# Patient Record
Sex: Male | Born: 1970 | Race: Black or African American | Hispanic: No | Marital: Married | Smoking: Never smoker
Health system: Southern US, Community
[De-identification: ages and names within clinical notes are randomized; demographics above are authoritative.]

## PROBLEM LIST (undated history)

## (undated) DIAGNOSIS — I10 Essential (primary) hypertension: Secondary | ICD-10-CM

## (undated) HISTORY — PX: COLON SURGERY: SHX602

---

## 2009-10-02 ENCOUNTER — Ambulatory Visit: Payer: Self-pay | Admitting: Family Medicine

## 2013-07-12 ENCOUNTER — Ambulatory Visit: Payer: Self-pay | Admitting: Family Medicine

## 2013-07-12 LAB — GC/CHLAMYDIA PROBE AMP

## 2013-12-14 DIAGNOSIS — R361 Hematospermia: Secondary | ICD-10-CM | POA: Insufficient documentation

## 2013-12-14 DIAGNOSIS — N433 Hydrocele, unspecified: Secondary | ICD-10-CM | POA: Insufficient documentation

## 2015-12-30 ENCOUNTER — Encounter: Payer: Self-pay | Admitting: Emergency Medicine

## 2015-12-30 ENCOUNTER — Ambulatory Visit
Admission: EM | Admit: 2015-12-30 | Discharge: 2015-12-30 | Disposition: A | Discharge status: Home / Self Care | Payer: 59 | Attending: Family Medicine | Admitting: Family Medicine

## 2015-12-30 ENCOUNTER — Ambulatory Visit (INDEPENDENT_AMBULATORY_CARE_PROVIDER_SITE_OTHER): Payer: 59

## 2015-12-30 DIAGNOSIS — S82891A Other fracture of right lower leg, initial encounter for closed fracture: Secondary | ICD-10-CM

## 2015-12-30 MED ORDER — MELOXICAM 15 MG PO TABS: 15.0000 mg | ORAL_TABLET | Freq: Every day | ORAL | Status: DC | PRN

## 2015-12-30 NOTE — ED Provider Notes (Signed)
[facility]  ____________________________________________  Time seen: Approximately 10:16 AM  I have reviewed the triage vital signs and the nursing notes.   HISTORY  Chief Complaint Ankle Pain   HPI SUNDAY CAPPEL is a 45 y.o. male presents with a complaint of medial right ankle pain since yesterday. Patient reports that yesterday afternoon he was playing basketball. Patient states that he turned quickly to turn and pivot and rolled ankle and felt a pop. He states he has had pain in the medial ankle since then. Denies other pain or injury. Denies fall to the ground. Denies head injury or loss consciousness.  Patient reports current pain is 7 out of 10. Patient states pain is primarily when walking. Patient states he has minimal pain if sitting still. Denies pain radiation. Denies numbness, tingling, loss of sensation. States some mild decreased range of motion. Reports has continued intermittent. States today has been using crutches.  Denies other pain or injury. Patient reports that approximately 20 years ago he had a mild injury to this ankle that he did have physical therapy for her. But states that he did have x-rays at the time but never had a fracture. Denies any other injury to this ankle.   History reviewed. No pertinent past medical history.  There are no active problems to display for this patient.   History reviewed. No pertinent past surgical history.  Current Outpatient Rx  Name  Route  Sig  Dispense  Refill  .             Allergies Review of patient's allergies indicates no known allergies.  History reviewed. No pertinent family history.  Social History Social History  Substance Use Topics  . Smoking status: Never Smoker   . Smokeless tobacco: None  . Alcohol Use: Yes    Review of Systems Constitutional: No fever/chills Eyes: No visual changes. ENT: No sore throat. Cardiovascular: Denies chest pain. Respiratory: Denies shortness of  breath. Gastrointestinal: No abdominal pain.  No nausea, no vomiting.  No diarrhea.  No constipation. Genitourinary: Negative for dysuria. Musculoskeletal: Negative for back pain.Right ankle pain.  Skin: Negative for rash. Neurological: Negative for headaches, focal weakness or numbness.  10-point ROS otherwise negative.  ____________________________________________   PHYSICAL EXAM:  VITAL SIGNS: ED Triage Vitals  Enc Vitals Group     BP 12/30/15 0911 139/87 mmHg     Pulse Rate 12/30/15 0911 77     Resp 12/30/15 0911 16     Temp 12/30/15 0911 97 F (36.1 C)     Temp Source 12/30/15 0911 Tympanic     SpO2 12/30/15 0911 99 %     Weight 12/30/15 0911 205 lb (92.987 kg)     Height 12/30/15 0911 5\' 7"  (1.702 m)     Head Cir --      Peak Flow --      Pain Score 12/30/15 0914 6     Pain Loc --      Pain Edu? --      Excl. in [place]? --     Constitutional: Alert and oriented. Well appearing and in no acute distress. Eyes: Conjunctivae are normal. PERRL. EOMI. Head: Atraumatic.  Nose: No congestion/rhinnorhea.  Mouth/Throat: Mucous membranes are moist.  Oropharynx non-erythematous. Neck: No stridor.  No cervical spine tenderness to palpation. Cardiovascular: Normal rate, regular rhythm. Grossly normal heart sounds.  Good peripheral circulation. Respiratory: Normal respiratory effort.  No retractions. Lungs CTAB. Musculoskeletal: No lower or upper extremity tenderness nor edema. Bilateral  pedal pulses equal and easily palpated.  Except: distal medial malleolus point moderate tenderness to palpation, no surrounding tenderness, mild swelling, no ecchymosis, skin intact, mildly limited rotation, no pain with plantar or dorsiflexion, Right lower extremity otherwise nontender. Capillary refill <2seconds to all right distal toes with sensation intact. Mild antalgic gait.  Neurologic:  Normal speech and language. No gross focal neurologic deficits are appreciated. No gait instability. Skin:   Skin is warm, dry and intact. No rash noted. Psychiatric: Mood and affect are normal. Speech and behavior are normal.  ____________________________________________   LABS (all labs ordered are listed, but only abnormal results are displayed)  Labs Reviewed - No data to display  RADIOLOGY  EXAM: RIGHT ANKLE - COMPLETE 3+ VIEW  COMPARISON: None.  FINDINGS: The small bone fragments are noted adjacent to the medial malleolus which appear well corticated and likely related to old injury. No acute fracture, subluxation or dislocation. Joint spaces are maintained. Soft tissues are intact.  IMPRESSION: Well corticated bone fragments adjacent to the medial malleolus felt to be related to old injury. No definite acute fracture.   Electronically Signed By: Rolm Baptise M.D. On: 12/30/2015 09:54  I, Marylene Land, personally viewed and evaluated these images (plain radiographs) as part of my medical decision making, as well as reviewing the written report by the radiologist.  _________________________________________   PROCEDURES  Procedure(s) performed:  Stirrup ocl splint applied by RN. Intact post application. ____________________________________________   INITIAL IMPRESSION / ASSESSMENT AND PLAN / ED COURSE  Pertinent labs & imaging results that were available during my care of the patient were reviewed by me and considered in my medical decision making (see chart for details).  Very well-appearing patient. No acute distress. Presents for complaints of medial ankle pain since yesterday. Patient point tenderness on medial right malleolus. No other surrounding tenderness minimal swelling. Mild decreased range of motion ankle rotation. Denies any other pain or injury. Right ankle x-ray with well-corticated bone fragments adjacent to the medial malleolus felt to be related to an old injury, no definite acute fracture per radiologist. As x-ray fragments consistent with point  tenderness by palpation as well as with NO previous known fracture to this ankle will  treat as an acute fracture. Splint applied. Patient has crutches. Will treat with oral Mobic. Patient denies any other need for additional pain medication. Follow-up with orthopedics this week. Patient states he'll call today to schedule follow-up. Denies need for crutches.   Discussed follow up with Primary care physician this week. Discussed follow up and return parameters including no resolution or any worsening concerns. Patient verbalized understanding and agreed to plan.   ____________________________________________   FINAL CLINICAL IMPRESSION(S) / ED DIAGNOSES  Final diagnoses:  Closed right ankle fracture, initial encounter      Note: This dictation was prepared with Dragon dictation along with smaller phrase technology. Any transcriptional errors that result from this process are unintentional.    Marylene Land, NP 12/30/15 1039

## 2015-12-30 NOTE — Discharge Instructions (Signed)
Take medication as prescribed. Rest. Apply ice and elevate. Use crutches. Keep in splint.  Call today to schedule appointment follow-up with orthopedic. See above to call.  Follow up with your primary care physician this week as needed. Return to Urgent care for new or worsening concerns.

## 2015-12-30 NOTE — ED Notes (Signed)
Patient states that he was playing basketball and put pressure on his right ankle and felt a pop in his ankle yesterday.  Patient report pain and swelling in his right ankle.

## 2016-12-28 ENCOUNTER — Encounter: Payer: Self-pay | Admitting: *Deleted

## 2016-12-28 ENCOUNTER — Ambulatory Visit
Admission: EM | Admit: 2016-12-28 | Discharge: 2016-12-28 | Disposition: A | Discharge status: Home / Self Care | Payer: 59 | Attending: Family Medicine | Admitting: Family Medicine

## 2016-12-28 DIAGNOSIS — T148XXA Other injury of unspecified body region, initial encounter: Secondary | ICD-10-CM

## 2016-12-28 DIAGNOSIS — Z23 Encounter for immunization: Secondary | ICD-10-CM

## 2016-12-28 DIAGNOSIS — W60XXXA Contact with nonvenomous plant thorns and spines and sharp leaves, initial encounter: Secondary | ICD-10-CM

## 2016-12-28 DIAGNOSIS — L03011 Cellulitis of right finger: Secondary | ICD-10-CM | POA: Diagnosis not present

## 2016-12-28 MED ORDER — TETANUS-DIPHTH-ACELL PERTUSSIS 5-2.5-18.5 LF-MCG/0.5 IM SUSP
0.5000 mL | Freq: Once | INTRAMUSCULAR | Status: AC
  Administered 2016-12-28: 0.5 mL via INTRAMUSCULAR

## 2016-12-28 MED ORDER — SULFAMETHOXAZOLE-TRIMETHOPRIM 800-160 MG PO TABS: 1.0000 | ORAL_TABLET | Freq: Two times a day (BID) | ORAL | 0 refills | Status: AC

## 2016-12-28 MED ORDER — MUPIROCIN 2 % EX OINT: TOPICAL_OINTMENT | CUTANEOUS | 0 refills | Status: DC

## 2016-12-28 NOTE — ED Triage Notes (Signed)
Pt got thorn in right middle finger while mowing yesterday. Now c/o swollen, painful right middle finger.

## 2016-12-28 NOTE — ED Provider Notes (Signed)
[facility] ____________________________________________  Time seen: Approximately 9:50 AM  I have reviewed the triage vital signs and the nursing notes.   HISTORY  Chief Complaint Hand Pain   HPI PEACE JOST is a 46 y.o. male presents for evaluation of right third digit redness and swelling. Patient reports that yesterday he was working in his yard, mowing grass and pulling weeds. Patient reports that he reached down into a rose bush and got a rose thorn stuck in his right third finger but did not realize it right away. Patient reports shortly after he noticed and then pulled the thorn out. Patient states that he and his wife tried to inspect the area last night as they were concerned for retained piece of the thorn, but did not see anything. Reports since last night the area has become tender, puffy and slightly red. Denies any drainage, paresthesias, pain radiation, decreased movement. Reports right-hand dominant. Unsure of last tetanus immunization. States mild pain at this time. Reports otherwise feels well. Denies fall, direct trauma or crush injury.  Denies chest pain, shortness of breath, abdominal pain. Denies recent sickness. Denies recent antibiotic use. Denies renal insufficiency.   History reviewed. No pertinent past medical history.  There are no active problems to display for this patient.   History reviewed. No pertinent surgical history.   No current facility-administered medications for this encounter.   Current Outpatient Prescriptions:  .  meloxicam (MOBIC) 15 MG tablet, Take 1 tablet (15 mg total) by mouth daily as needed for pain., Disp: 10 tablet, Rfl: 0 .  mupirocin ointment (BACTROBAN) 2 %, Apply three times a day for 7 days., Disp: 22 g, Rfl: 0 .  sulfamethoxazole-trimethoprim (BACTRIM DS,SEPTRA DS) 800-160 MG tablet, Take 1 tablet by mouth 2 (two) times daily., Disp: 20 tablet, Rfl: 0  Allergies Patient has no known allergies.  History  reviewed. No pertinent family history.  Social History Social History  Substance Use Topics  . Smoking status: Never Smoker  . Smokeless tobacco: Never Used  . Alcohol use Yes    Review of Systems Constitutional: No fever/chills Cardiovascular: Denies chest pain. Respiratory: Denies shortness of breath. Gastrointestinal: No abdominal pain.   Genitourinary: Negative for dysuria. Musculoskeletal: Negative for back pain. Skin: As above.   ____________________________________________   PHYSICAL EXAM:  VITAL SIGNS: ED Triage Vitals  Enc Vitals Group     BP 12/28/16 0858 (!) 153/99     Pulse Rate 12/28/16 0858 70     Resp 12/28/16 0858 16     Temp 12/28/16 0858 98 F (36.7 C)     Temp Source 12/28/16 0858 Oral     SpO2 12/28/16 0858 99 %     Weight 12/28/16 0901 220 lb (99.8 kg)     Height 12/28/16 0901 5\' 7"  (1.702 m)     Head Circumference --      Peak Flow --      Pain Score 12/28/16 0944 1     Pain Loc --      Pain Edu? --      Excl. in [place]? --     Constitutional: Alert and oriented. Well appearing and in no acute distress. Hematological/Lymphatic/Immunilogical: No cervical lymphadenopathy. Cardiovascular: Normal rate, regular rhythm. Grossly normal heart sounds.  Good peripheral circulation. Respiratory: Normal respiratory effort without tachypnea nor retractions. Breath sounds are clear and equal bilaterally. No wheezes, rales, rhonchi. Gastrointestinal: Soft and nontender. No distention. Normal Bowel sounds. No CVA tenderness. Musculoskeletal:  Nontender with normal range  of motion in all extremities. No midline cervical, thoracic or lumbar tenderness to palpation. Bilateral pedal pulses equal and easily palpated.      Right lower leg:  No tenderness or edema.      Left lower leg:  No tenderness or edema.  Neurologic:  Normal speech and language. Speech is normal. No gait instability.  Skin:  Skin is warm, dry. Except: Right third digit radial aspect adjacent to  PIP joint localize area of mild erythema approximately 2 x 2 centimeters with mild localized edema, no point bony tenderness, mild tenderness directly over the erythematous site, no foreign body visualized, no drainage, no fluctuance or induration, wound inspected as below without foreign body found, full range of motion present, no motor or tendon deficit noted to right hand. Right hand based upon superficial appearing splinter noted, no surrounding erythema, no drainage, nontender, removed as below and no appearance of retained foreign body noted. Right hand otherwise nontender. Psychiatric: Mood and affect are normal. Speech and behavior are normal. Patient exhibits appropriate insight and judgment   ___________________________________________   LABS (all labs ordered are listed, but only abnormal results are displayed)   RADIOLOGY  No results found. ____________________________________________   PROCEDURES Procedures   Procedure explained and verbal consent obtained for superficial wound exploration and removal of foreign body. Area cleaned and prepped with Betadine. Sterile #11 blade scalpel and forceps utilized and very superficially inspected wound to right third digit with slight and no retained foreign body visualized; area cleaned with Betadine to right palm where splinter visible and sterile #11 blade scalpel and forceps utilized and superficially infected skin and removed splinter. Patient tolerated well. No bleeding.  INITIAL IMPRESSION / ASSESSMENT AND PLAN / ED COURSE  Pertinent labs & imaging results that were available during my care of the patient were reviewed by me and considered in my medical decision making (see chart for details).  Well-appearing patient. No acute distress. Right third digit onset of cellulitis, no retained foreign body visualized or noted upon superficial exploration. Right palm foreign body removed. Will treat patient with oral Bactrim and topical  Bactroban. Tetanus immunization updated. Discussed cleaning and wound care. Discussed strict follow-up and return parameters.Discussed indication, risks and benefits of medications with patient.  Discussed follow up with Primary care physician this week. Discussed follow up and return parameters including no resolution or any worsening concerns. Patient verbalized understanding and agreed to plan.   ____________________________________________   FINAL CLINICAL IMPRESSION(S) / ED DIAGNOSES  Final diagnoses:  Cellulitis of right middle finger  Splinter     Discharge Medication List as of 12/28/2016  9:41 AM    START taking these medications   Details  mupirocin ointment (BACTROBAN) 2 % Apply three times a day for 7 days., Normal    sulfamethoxazole-trimethoprim (BACTRIM DS,SEPTRA DS) 800-160 MG tablet Take 1 tablet by mouth 2 (two) times daily., Starting Mon 12/28/2016, Until Thu 01/07/2017, Normal        Note: This dictation was prepared with Dragon dictation along with smaller phrase technology. Any transcriptional errors that result from this process are unintentional.         Marylene Land, NP 12/28/16 [phone]

## 2016-12-28 NOTE — Discharge Instructions (Signed)
Take medication as prescribed. Keep clean. Elevate. Monitor.   Follow up with your primary care physician this week as needed. Return to Urgent care for new or worsening concerns.

## 2018-02-04 ENCOUNTER — Ambulatory Visit: Payer: 59 | Admitting: Family Medicine

## 2018-02-25 ENCOUNTER — Ambulatory Visit: Payer: 59 | Admitting: Primary Care

## 2018-02-28 ENCOUNTER — Encounter: Payer: Self-pay | Admitting: Primary Care

## 2018-02-28 ENCOUNTER — Ambulatory Visit (INDEPENDENT_AMBULATORY_CARE_PROVIDER_SITE_OTHER): Payer: 59 | Admitting: Primary Care

## 2018-02-28 ENCOUNTER — Encounter (INDEPENDENT_AMBULATORY_CARE_PROVIDER_SITE_OTHER): Payer: Self-pay

## 2018-02-28 VITALS — BP 138/84 | HR 67 | Temp 98.2°F | Ht 67.0 in | Wt 217.2 lb

## 2018-02-28 DIAGNOSIS — Z0000 Encounter for general adult medical examination without abnormal findings: Secondary | ICD-10-CM | POA: Diagnosis not present

## 2018-02-28 DIAGNOSIS — Z1211 Encounter for screening for malignant neoplasm of colon: Secondary | ICD-10-CM | POA: Diagnosis not present

## 2018-02-28 DIAGNOSIS — Z8042 Family history of malignant neoplasm of prostate: Secondary | ICD-10-CM

## 2018-02-28 NOTE — Patient Instructions (Signed)
Stop by the lab prior to leaving today. I will notify you of your results once received.   Continue exercising. You should be getting 150 minutes of moderate intensity exercise weekly.  Increase consumption of vegetables, fruit, whole grains, lean protein.  Ensure you are consuming 64 ounces of water daily.  Your blood pressure should be less than 135/90.   Follow up in 1 year for your annual exam or sooner if needed.  It was a pleasure to meet you today! Please don't hesitate to call or message me with any questions. Welcome to [facility]!   DASH Eating Plan DASH stands for "Dietary Approaches to Stop Hypertension." The DASH eating plan is a healthy eating plan that has been shown to reduce high blood pressure (hypertension). It may also reduce your risk for type 2 diabetes, heart disease, and stroke. The DASH eating plan may also help with weight loss. What are tips for following this plan? General guidelines  Avoid eating more than 2,300 mg (milligrams) of salt (sodium) a day. If you have hypertension, you may need to reduce your sodium intake to 1,500 mg a day.  Limit alcohol intake to no more than 1 drink a day for nonpregnant women and 2 drinks a day for men. One drink equals 12 oz of beer, 5 oz of wine, or 1 oz of hard liquor.  Work with your health care provider to maintain a healthy body weight or to lose weight. Ask what an ideal weight is for you.  Get at least 30 minutes of exercise that causes your heart to beat faster (aerobic exercise) most days of the week. Activities may include walking, swimming, or biking.  Work with your health care provider or diet and nutrition specialist (dietitian) to adjust your eating plan to your individual calorie needs. Reading food labels  Check food labels for the amount of sodium per serving. Choose foods with less than 5 percent of the Daily Value of sodium. Generally, foods with less than 300 mg of sodium per serving fit into this  eating plan.  To find whole grains, look for the word "whole" as the first word in the ingredient list. Shopping  Buy products labeled as "low-sodium" or "no salt added."  Buy fresh foods. Avoid canned foods and premade or frozen meals. Cooking  Avoid adding salt when cooking. Use salt-free seasonings or herbs instead of table salt or sea salt. Check with your health care provider or pharmacist before using salt substitutes.  Do not fry foods. Cook foods using healthy methods such as baking, boiling, grilling, and broiling instead.  Cook with heart-healthy oils, such as olive, canola, soybean, or sunflower oil. Meal planning   Eat a balanced diet that includes: ? 5 or more servings of fruits and vegetables each day. At each meal, try to fill half of your plate with fruits and vegetables. ? Up to 6-8 servings of whole grains each day. ? Less than 6 oz of lean meat, poultry, or fish each day. A 3-oz serving of meat is about the same size as a deck of cards. One egg equals 1 oz. ? 2 servings of low-fat dairy each day. ? A serving of nuts, seeds, or beans 5 times each week. ? Heart-healthy fats. Healthy fats called Omega-3 fatty acids are found in foods such as flaxseeds and coldwater fish, like sardines, salmon, and mackerel.  Limit how much you eat of the following: ? Canned or prepackaged foods. ? Food that is high in trans  fat, such as fried foods. ? Food that is high in saturated fat, such as fatty meat. ? Sweets, desserts, sugary drinks, and other foods with added sugar. ? Full-fat dairy products.  Do not salt foods before eating.  Try to eat at least 2 vegetarian meals each week.  Eat more home-cooked food and less restaurant, buffet, and fast food.  When eating at a restaurant, ask that your food be prepared with less salt or no salt, if possible. What foods are recommended? The items listed may not be a complete list. Talk with your dietitian about what dietary choices  are best for you. Grains Whole-grain or whole-wheat bread. Whole-grain or whole-wheat pasta. Brown rice. [place]. Bulgur. Whole-grain and low-sodium cereals. Pita bread. Low-fat, low-sodium crackers. Whole-wheat flour tortillas. Vegetables Fresh or frozen vegetables (raw, steamed, roasted, or grilled). Low-sodium or reduced-sodium tomato and vegetable juice. Low-sodium or reduced-sodium tomato sauce and tomato paste. Low-sodium or reduced-sodium canned vegetables. Fruits All fresh, dried, or frozen fruit. Canned fruit in natural juice (without added sugar). Meat and other protein foods Skinless chicken or [place]. Ground chicken or [place]. Pork with fat trimmed off. Fish and seafood. Egg whites. Dried beans, peas, or lentils. Unsalted nuts, nut butters, and seeds. Unsalted canned beans. Lean cuts of beef with fat trimmed off. Low-sodium, lean deli meat. Dairy Low-fat (1%) or fat-free (skim) milk. Fat-free, low-fat, or reduced-fat cheeses. Nonfat, low-sodium ricotta or cottage cheese. Low-fat or nonfat yogurt. Low-fat, low-sodium cheese. Fats and oils Soft margarine without trans fats. Vegetable oil. Low-fat, reduced-fat, or light mayonnaise and salad dressings (reduced-sodium). Canola, safflower, olive, soybean, and sunflower oils. Avocado. Seasoning and other foods Herbs. Spices. Seasoning mixes without salt. Unsalted popcorn and pretzels. Fat-free sweets. What foods are not recommended? The items listed may not be a complete list. Talk with your dietitian about what dietary choices are best for you. Grains Baked goods made with fat, such as croissants, muffins, or some breads. Dry pasta or rice meal packs. Vegetables Creamed or fried vegetables. Vegetables in a cheese sauce. Regular canned vegetables (not low-sodium or reduced-sodium). Regular canned tomato sauce and paste (not low-sodium or reduced-sodium). Regular tomato and vegetable juice (not low-sodium or reduced-sodium). Angie Fava.  Olives. Fruits Canned fruit in a light or heavy syrup. Fried fruit. Fruit in cream or butter sauce. Meat and other protein foods Fatty cuts of meat. Ribs. Fried meat. Berniece Salines. Sausage. Bologna and other processed lunch meats. Salami. Fatback. Hotdogs. Bratwurst. Salted nuts and seeds. Canned beans with added salt. Canned or smoked fish. Whole eggs or egg yolks. Chicken or [place] with skin. Dairy Whole or 2% milk, cream, and half-and-half. Whole or full-fat cream cheese. Whole-fat or sweetened yogurt. Full-fat cheese. Nondairy creamers. Whipped toppings. Processed cheese and cheese spreads. Fats and oils Butter. Stick margarine. Lard. Shortening. Ghee. Bacon fat. Tropical oils, such as coconut, palm kernel, or palm oil. Seasoning and other foods Salted popcorn and pretzels. Onion salt, garlic salt, seasoned salt, table salt, and sea salt. Worcestershire sauce. Tartar sauce. Barbecue sauce. Teriyaki sauce. Soy sauce, including reduced-sodium. Steak sauce. Canned and packaged gravies. Fish sauce. Oyster sauce. Cocktail sauce. Horseradish that you find on the shelf. Ketchup. Mustard. Meat flavorings and tenderizers. Bouillon cubes. Hot sauce and Tabasco sauce. Premade or packaged marinades. Premade or packaged taco seasonings. Relishes. Regular salad dressings. Where to find more information:  National Heart, Lung, and [place]: https://wilson-eaton.com/  American Heart Association: www.heart.org Summary  The DASH eating plan is a healthy eating plan that has  been shown to reduce high blood pressure (hypertension). It may also reduce your risk for type 2 diabetes, heart disease, and stroke.  With the DASH eating plan, you should limit salt (sodium) intake to 2,300 mg a day. If you have hypertension, you may need to reduce your sodium intake to 1,500 mg a day.  When on the DASH eating plan, aim to eat more fresh fruits and vegetables, whole grains, lean proteins, low-fat dairy, and heart-healthy  fats.  Work with your health care provider or diet and nutrition specialist (dietitian) to adjust your eating plan to your individual calorie needs. This information is not intended to replace advice given to you by your health care provider. Make sure you discuss any questions you have with your health care provider. Document Released: 08/20/2011 Document Revised: 08/24/2016 Document Reviewed: 08/24/2016 Elsevier Interactive Patient Education  [employer].

## 2018-02-28 NOTE — Progress Notes (Signed)
Subjective:    Patient ID: Keith Mills, male    DOB: [birth year], 47 y.o.   MRN: [id number]  HPI  Mr. Delange is a 47 year old male who presents today to establish care and for complete physical.  His father was diagnosed with prostate cancer at age 70. He was told by his brother's doctor to get screened. He would also like a screening colonoscopy. He has no complaints today.   Immunizations: -Tetanus: Completed in 2018 -Influenza: Did complete last season   Diet: He endorses a fair diet Breakfast: Eggs, waffles, sausage occasionally Lunch: Salad, hot dogs, fast food Dinner: Protein, some vegetables, starch Snacks: Occasionally fruit, chips, nuts Desserts: Once weekly Beverages: Some water, sparkling water, lemonade, occasional sweet tea  Exercise: He Is working out at [employer] 4 days weekly Eye exam: Completed 1 year ago Dental exam: Completes semi-annual    Review of Systems  Constitutional: Negative for unexpected weight change.  HENT: Negative for rhinorrhea.   Respiratory: Negative for cough and shortness of breath.   Cardiovascular: Negative for chest pain.  Gastrointestinal: Negative for constipation and diarrhea.  Genitourinary: Negative for difficulty urinating.  Musculoskeletal: Negative for arthralgias and myalgias.  Skin: Negative for rash.  Allergic/Immunologic: Negative for environmental allergies.  Neurological: Negative for dizziness, numbness and headaches.  Psychiatric/Behavioral: The patient is not nervous/anxious.        History reviewed. No pertinent past medical history.   Social History   Socioeconomic History  . Marital status: Married    Spouse name: Not on file  . Number of children: Not on file  . Years of education: Not on file  . Highest education level: Not on file  Occupational History  . Not on file  Social Needs  . Financial resource strain: Not on file  . Food insecurity:    Worry: Not on file    Inability: Not on file    . Transportation needs:    Medical: Not on file    Non-medical: Not on file  Tobacco Use  . Smoking status: Never Smoker  . Smokeless tobacco: Never Used  Substance and Sexual Activity  . Alcohol use: Yes  . Drug use: Not on file  . Sexual activity: Not on file  Lifestyle  . Physical activity:    Days per week: Not on file    Minutes per session: Not on file  . Stress: Not on file  Relationships  . Social connections:    Talks on phone: Not on file    Gets together: Not on file    Attends religious service: Not on file    Active member of club or organization: Not on file    Attends meetings of clubs or organizations: Not on file    Relationship status: Not on file  . Intimate partner violence:    Fear of current or ex partner: Not on file    Emotionally abused: Not on file    Physically abused: Not on file    Forced sexual activity: Not on file  Other Topics Concern  . Not on file  Social History Narrative   Married.   2 children.   Works as a Armed forces operational officer.   Enjoys playing sports with his children, exercising.     History reviewed. No pertinent surgical history.  Family History  Problem Relation Age of Onset  . Kidney failure Mother   . Hypertension Mother   . Breast cancer Mother   . Prostate cancer Father  82  . Heart disease Father   . Hypertension Sister   . Colon polyps Sister     No Known Allergies  No current outpatient medications on file prior to visit.   No current facility-administered medications on file prior to visit.     BP 138/84   Pulse 67   Temp 98.2 F (36.8 C) (Oral)   Ht 5\' 7"  (1.702 m)   Wt 217 lb 4 oz (98.5 kg)   SpO2 97%   BMI 34.03 kg/m    Objective:   Physical Exam  Constitutional: He is oriented to person, place, and time. He appears well-nourished.  HENT:  Mouth/Throat: No oropharyngeal exudate.  Eyes: Pupils are equal, round, and reactive to light. EOM are normal.  Neck: Neck supple. No thyromegaly present.   Cardiovascular: Normal rate and regular rhythm.  Respiratory: Effort normal and breath sounds normal.  GI: Soft. Bowel sounds are normal. There is no tenderness.  Musculoskeletal: Normal range of motion.  Neurological: He is alert and oriented to person, place, and time.  Skin: Skin is warm and dry.  Psychiatric: He has a normal mood and affect.           Assessment & Plan:

## 2018-03-01 ENCOUNTER — Encounter: Payer: Self-pay | Admitting: *Deleted

## 2018-03-01 LAB — LIPID PANEL
CHOLESTEROL: 166 mg/dL (ref 0–200)
HDL: 45.2 mg/dL (ref >39.00)
NonHDL: 121.21
Total CHOL/HDL Ratio: 4
Triglycerides: 335 mg/dL — ABNORMAL HIGH (ref 0.0–149.0)
VLDL: 67 mg/dL — AB (ref 0.0–40.0)

## 2018-03-01 LAB — COMPREHENSIVE METABOLIC PANEL
ALBUMIN: 4.3 g/dL (ref 3.5–5.2)
ALK PHOS: 69 U/L (ref 39–117)
ALT: 30 U/L (ref 0–53)
AST: 31 U/L (ref 0–37)
BILIRUBIN TOTAL: 0.5 mg/dL (ref 0.2–1.2)
BUN: 18 mg/dL (ref 6–23)
CO2: 28 mEq/L (ref 19–32)
CREATININE: 1.37 mg/dL (ref 0.40–1.50)
Calcium: 9.7 mg/dL (ref 8.4–10.5)
Chloride: 102 mEq/L (ref 96–112)
GFR: 71.26 mL/min (ref >60.00)
GLUCOSE: 99 mg/dL (ref 70–99)
POTASSIUM: 4.4 meq/L (ref 3.5–5.1)
SODIUM: 137 meq/L (ref 135–145)
TOTAL PROTEIN: 7 g/dL (ref 6.0–8.3)

## 2018-03-01 LAB — LDL CHOLESTEROL, DIRECT: LDL DIRECT: 77 mg/dL

## 2018-03-01 LAB — PSA: PSA: 0.95 ng/mL (ref 0.10–4.00)

## 2018-03-30 ENCOUNTER — Encounter: Payer: Self-pay | Admitting: *Deleted

## 2019-03-07 ENCOUNTER — Ambulatory Visit (INDEPENDENT_AMBULATORY_CARE_PROVIDER_SITE_OTHER): Payer: 59 | Admitting: Primary Care

## 2019-03-07 ENCOUNTER — Encounter: Payer: Self-pay | Admitting: Primary Care

## 2019-03-07 ENCOUNTER — Other Ambulatory Visit: Payer: Self-pay | Admitting: Primary Care

## 2019-03-07 ENCOUNTER — Other Ambulatory Visit: Payer: Self-pay

## 2019-03-07 VITALS — BP 136/82 | HR 76 | Temp 98.6°F | Ht 67.0 in | Wt 220.2 lb

## 2019-03-07 DIAGNOSIS — Z0000 Encounter for general adult medical examination without abnormal findings: Secondary | ICD-10-CM | POA: Insufficient documentation

## 2019-03-07 DIAGNOSIS — R739 Hyperglycemia, unspecified: Secondary | ICD-10-CM

## 2019-03-07 DIAGNOSIS — Z0001 Encounter for general adult medical examination with abnormal findings: Secondary | ICD-10-CM | POA: Insufficient documentation

## 2019-03-07 LAB — LIPID PANEL
Cholesterol: 147 mg/dL (ref 0–200)
HDL: 51.7 mg/dL (ref >39.00)
LDL Cholesterol: 82 mg/dL (ref 0–99)
NonHDL: 95.29
Total CHOL/HDL Ratio: 3
Triglycerides: 64 mg/dL (ref 0.0–149.0)
VLDL: 12.8 mg/dL (ref 0.0–40.0)

## 2019-03-07 LAB — COMPREHENSIVE METABOLIC PANEL
ALT: 25 U/L (ref 0–53)
AST: 30 U/L (ref 0–37)
Albumin: 4.3 g/dL (ref 3.5–5.2)
Alkaline Phosphatase: 71 U/L (ref 39–117)
BUN: 22 mg/dL (ref 6–23)
CO2: 26 mEq/L (ref 19–32)
Calcium: 9.2 mg/dL (ref 8.4–10.5)
Chloride: 104 mEq/L (ref 96–112)
Creatinine, Ser: 1.41 mg/dL (ref 0.40–1.50)
GFR: 64.58 mL/min (ref >60.00)
Glucose, Bld: 123 mg/dL — ABNORMAL HIGH (ref 70–99)
Potassium: 4.3 mEq/L (ref 3.5–5.1)
Sodium: 136 mEq/L (ref 135–145)
Total Bilirubin: 0.9 mg/dL (ref 0.2–1.2)
Total Protein: 7 g/dL (ref 6.0–8.3)

## 2019-03-07 NOTE — Patient Instructions (Signed)
Stop by the lab prior to leaving today. I will notify you of your results once received.   Continue exercising. You should be getting 150 minutes of moderate intensity exercise weekly.  Continue to work on a healthy diet.   Ensure you are consuming 64 ounces of water daily.  It was a pleasure to see you today!   Preventive Care 40-64 Years, Male Preventive care refers to lifestyle choices and visits with your health care provider that can promote health and wellness. What does preventive care include?   A yearly physical exam. This is also called an annual well check.  Dental exams once or twice a year.  Routine eye exams. Ask your health care provider how often you should have your eyes checked.  Personal lifestyle choices, including: ? Daily care of your teeth and gums. ? Regular physical activity. ? Eating a healthy diet. ? Avoiding tobacco and drug use. ? Limiting alcohol use. ? Practicing safe sex. ? Taking low-dose aspirin every day starting at age 61. What happens during an annual well check? The services and screenings done by your health care provider during your annual well check will depend on your age, overall health, lifestyle risk factors, and family history of disease. Counseling Your health care provider may ask you questions about your:  Alcohol use.  Tobacco use.  Drug use.  Emotional well-being.  Home and relationship well-being.  Sexual activity.  Eating habits.  Work and work Statistician. Screening You may have the following tests or measurements:  Height, weight, and BMI.  Blood pressure.  Lipid and cholesterol levels. These may be checked every 5 years, or more frequently if you are over 46 years old.  Skin check.  Lung cancer screening. You may have this screening every year starting at age 100 if you have a 30-pack-year history of smoking and currently smoke or have quit within the past 15 years.  Colorectal cancer screening. All  adults should have this screening starting at age 39 and continuing until age 70. Your health care provider may recommend screening at age 85. You will have tests every 1-10 years, depending on your results and the type of screening test. People at increased risk should start screening at an earlier age. Screening tests may include: ? Guaiac-based fecal occult blood testing. ? Fecal immunochemical test (FIT). ? Stool DNA test. ? Virtual colonoscopy. ? Sigmoidoscopy. During this test, a flexible tube with a tiny camera (sigmoidoscope) is used to examine your rectum and lower colon. The sigmoidoscope is inserted through your anus into your rectum and lower colon. ? Colonoscopy. During this test, a long, thin, flexible tube with a tiny camera (colonoscope) is used to examine your entire colon and rectum.  Prostate cancer screening. Recommendations will vary depending on your family history and other risks.  Hepatitis C blood test.  Hepatitis B blood test.  Sexually transmitted disease (STD) testing.  Diabetes screening. This is done by checking your blood sugar (glucose) after you have not eaten for a while (fasting). You may have this done every 1-3 years. Discuss your test results, treatment options, and if necessary, the need for more tests with your health care provider. Vaccines Your health care provider may recommend certain vaccines, such as:  Influenza vaccine. This is recommended every year.  Tetanus, diphtheria, and acellular pertussis (Tdap, Td) vaccine. You may need a Td booster every 10 years.  Varicella vaccine. You may need this if you have not been vaccinated.  Zoster vaccine. You  may need this after age 60.  Measles, mumps, and rubella (MMR) vaccine. You may need at least one dose of MMR if you were born in 1957 or later. You may also need a second dose.  Pneumococcal 13-valent conjugate (PCV13) vaccine. You may need this if you have certain conditions and have not been  vaccinated.  Pneumococcal polysaccharide (PPSV23) vaccine. You may need one or two doses if you smoke cigarettes or if you have certain conditions.  Meningococcal vaccine. You may need this if you have certain conditions.  Hepatitis A vaccine. You may need this if you have certain conditions or if you travel or work in places where you may be exposed to hepatitis A.  Hepatitis B vaccine. You may need this if you have certain conditions or if you travel or work in places where you may be exposed to hepatitis B.  Haemophilus influenzae type b (Hib) vaccine. You may need this if you have certain risk factors. Talk to your health care provider about which screenings and vaccines you need and how often you need them. This information is not intended to replace advice given to you by your health care provider. Make sure you discuss any questions you have with your health care provider. Document Released: 09/27/2015 Document Revised: 10/21/2017 Document Reviewed: 07/02/2015 Elsevier Interactive Patient Education  2019 Elsevier Inc.  

## 2019-03-07 NOTE — Assessment & Plan Note (Signed)
Tetanus UTD. PSA from 2019 stable, repeat at age 48 next year. Commended him on a healthy diet and regular exercise, encouraged to continue.  Exam unremarkable. Labs pending. Follow up in 1 year for CPE.

## 2019-03-07 NOTE — Progress Notes (Signed)
Subjective:    Patient ID: Keith Mills, male    DOB: November 25, 1970, 48 y.o.   MRN: [id number]  HPI  Keith Mills is a 48 year old male who presents today for complete physical. He has no complaints today.  Immunizations: -Tetanus: Completed in 2018 -Influenza: Completes annually   Diet: He endorses a healthy diet. Recently started Keto diet and is limiting carbs and sugars. He is drinking mostly water, flavored water, unsweet tea.  Exercise: He is walking, some running, weight lifting  Eye exam: Completed 2 years ago. Dental exam: Completes semi-annually  PSA: Family history of prostate cancer in father. PSA in 2019 of 0.95  Review of Systems  Constitutional: Negative for unexpected weight change.  HENT: Negative for rhinorrhea.   Respiratory: Negative for cough and shortness of breath.   Cardiovascular: Negative for chest pain.  Gastrointestinal: Negative for constipation and diarrhea.  Genitourinary: Negative for difficulty urinating.  Musculoskeletal: Negative for arthralgias and myalgias.  Skin: Negative for rash.  Allergic/Immunologic: Negative for environmental allergies.  Neurological: Negative for dizziness, numbness and headaches.  Psychiatric/Behavioral: The patient is not nervous/anxious.        No past medical history on file.   Social History   Socioeconomic History  . Marital status: Married    Spouse name: Not on file  . Number of children: Not on file  . Years of education: Not on file  . Highest education level: Not on file  Occupational History  . Not on file  Social Needs  . Financial resource strain: Not on file  . Food insecurity    Worry: Not on file    Inability: Not on file  . Transportation needs    Medical: Not on file    Non-medical: Not on file  Tobacco Use  . Smoking status: Never Smoker  . Smokeless tobacco: Never Used  Substance and Sexual Activity  . Alcohol use: Yes  . Drug use: Not on file  . Sexual activity: Not on file   Lifestyle  . Physical activity    Days per week: Not on file    Minutes per session: Not on file  . Stress: Not on file  Relationships  . Social Herbalist on phone: Not on file    Gets together: Not on file    Attends religious service: Not on file    Active member of club or organization: Not on file    Attends meetings of clubs or organizations: Not on file    Relationship status: Not on file  . Intimate partner violence    Fear of current or ex partner: Not on file    Emotionally abused: Not on file    Physically abused: Not on file    Forced sexual activity: Not on file  Other Topics Concern  . Not on file  Social History Narrative   Married.   2 children.   Works as a Armed forces operational officer.   Enjoys playing sports with his children, exercising.     No past surgical history on file.  Family History  Problem Relation Age of Onset  . Kidney failure Mother   . Hypertension Mother   . Breast cancer Mother   . Prostate cancer Father 70  . Heart disease Father   . Hypertension Sister   . Colon polyps Sister     No Known Allergies  No current outpatient medications on file prior to visit.   No current facility-administered medications on  file prior to visit.     BP 136/82   Pulse 76   Temp 98.6 F (37 C) (Oral)   Ht 5\' 7"  (1.702 m)   Wt 220 lb 4 oz (99.9 kg)   SpO2 97%   BMI 34.50 kg/m    Objective:   Physical Exam  Constitutional: He is oriented to person, place, and time. He appears well-nourished.  HENT:  Mouth/Throat: No oropharyngeal exudate.  Eyes: Pupils are equal, round, and reactive to light. EOM are normal.  Neck: Neck supple. No thyromegaly present.  Cardiovascular: Normal rate and regular rhythm.  Respiratory: Effort normal and breath sounds normal.  GI: Soft. Bowel sounds are normal. There is no abdominal tenderness.  Musculoskeletal: Normal range of motion.  Neurological: He is alert and oriented to person, place, and time.  Skin:  Skin is warm and dry.  Psychiatric: He has a normal mood and affect.           Assessment & Plan:

## 2019-03-10 ENCOUNTER — Telehealth: Payer: Self-pay

## 2019-03-10 NOTE — Telephone Encounter (Signed)
Left detailed VM w COVID screen and back door lab info   

## 2019-03-12 DIAGNOSIS — H109 Unspecified conjunctivitis: Secondary | ICD-10-CM | POA: Diagnosis not present

## 2019-03-14 ENCOUNTER — Other Ambulatory Visit (INDEPENDENT_AMBULATORY_CARE_PROVIDER_SITE_OTHER): Payer: 59

## 2019-03-14 ENCOUNTER — Ambulatory Visit: Payer: 59 | Admitting: Family Medicine

## 2019-03-14 ENCOUNTER — Other Ambulatory Visit: Payer: Self-pay

## 2019-03-14 DIAGNOSIS — H1033 Unspecified acute conjunctivitis, bilateral: Secondary | ICD-10-CM | POA: Diagnosis not present

## 2019-03-14 DIAGNOSIS — R739 Hyperglycemia, unspecified: Secondary | ICD-10-CM

## 2019-03-14 LAB — POCT GLYCOSYLATED HEMOGLOBIN (HGB A1C): Hemoglobin A1C: 5.5 % (ref 4.0–5.6)

## 2019-06-16 ENCOUNTER — Other Ambulatory Visit: Payer: Self-pay

## 2019-06-16 DIAGNOSIS — Z20822 Contact with and (suspected) exposure to covid-19: Secondary | ICD-10-CM

## 2019-06-17 LAB — NOVEL CORONAVIRUS, NAA: SARS-CoV-2, NAA: NOT DETECTED

## 2019-06-22 ENCOUNTER — Other Ambulatory Visit: Payer: Self-pay

## 2019-06-22 DIAGNOSIS — Z20822 Contact with and (suspected) exposure to covid-19: Secondary | ICD-10-CM

## 2019-06-22 DIAGNOSIS — Z20828 Contact with and (suspected) exposure to other viral communicable diseases: Secondary | ICD-10-CM | POA: Diagnosis not present

## 2019-06-23 LAB — NOVEL CORONAVIRUS, NAA: SARS-CoV-2, NAA: DETECTED — AB

## 2020-01-30 DIAGNOSIS — S93501A Unspecified sprain of right great toe, initial encounter: Secondary | ICD-10-CM | POA: Diagnosis not present

## 2020-01-30 DIAGNOSIS — M79671 Pain in right foot: Secondary | ICD-10-CM | POA: Diagnosis not present

## 2020-03-08 ENCOUNTER — Ambulatory Visit (INDEPENDENT_AMBULATORY_CARE_PROVIDER_SITE_OTHER): Payer: 59 | Admitting: Primary Care

## 2020-03-08 ENCOUNTER — Encounter: Payer: Self-pay | Admitting: Primary Care

## 2020-03-08 ENCOUNTER — Other Ambulatory Visit: Payer: Self-pay

## 2020-03-08 VITALS — BP 172/96 | HR 87 | Temp 96.9°F | Ht 66.75 in | Wt 232.2 lb

## 2020-03-08 DIAGNOSIS — Z125 Encounter for screening for malignant neoplasm of prostate: Secondary | ICD-10-CM

## 2020-03-08 DIAGNOSIS — Z0000 Encounter for general adult medical examination without abnormal findings: Secondary | ICD-10-CM | POA: Diagnosis not present

## 2020-03-08 DIAGNOSIS — Z1211 Encounter for screening for malignant neoplasm of colon: Secondary | ICD-10-CM | POA: Diagnosis not present

## 2020-03-08 DIAGNOSIS — I10 Essential (primary) hypertension: Secondary | ICD-10-CM | POA: Insufficient documentation

## 2020-03-08 DIAGNOSIS — R030 Elevated blood-pressure reading, without diagnosis of hypertension: Secondary | ICD-10-CM | POA: Diagnosis not present

## 2020-03-08 LAB — COMPREHENSIVE METABOLIC PANEL
ALT: 31 U/L (ref 0–53)
AST: 30 U/L (ref 0–37)
Albumin: 4.3 g/dL (ref 3.5–5.2)
Alkaline Phosphatase: 84 U/L (ref 39–117)
BUN: 18 mg/dL (ref 6–23)
CO2: 23 mEq/L (ref 19–32)
Calcium: 9.3 mg/dL (ref 8.4–10.5)
Chloride: 105 mEq/L (ref 96–112)
Creatinine, Ser: 1.44 mg/dL (ref 0.40–1.50)
GFR: 62.77 mL/min (ref >60.00)
Glucose, Bld: 171 mg/dL — ABNORMAL HIGH (ref 70–99)
Potassium: 4.1 mEq/L (ref 3.5–5.1)
Sodium: 138 mEq/L (ref 135–145)
Total Bilirubin: 0.6 mg/dL (ref 0.2–1.2)
Total Protein: 7 g/dL (ref 6.0–8.3)

## 2020-03-08 LAB — CBC
HCT: 41.8 % (ref 39.0–52.0)
Hemoglobin: 14.2 g/dL (ref 13.0–17.0)
MCHC: 34.1 g/dL (ref 30.0–36.0)
MCV: 89.4 fl (ref 78.0–100.0)
Platelets: 211 10*3/uL (ref 150.0–400.0)
RBC: 4.68 Mil/uL (ref 4.22–5.81)
RDW: 13.7 % (ref 11.5–15.5)
WBC: 4 10*3/uL (ref 4.0–10.5)

## 2020-03-08 LAB — LIPID PANEL
Cholesterol: 166 mg/dL (ref 0–200)
HDL: 44.8 mg/dL (ref >39.00)
NonHDL: 121.24
Total CHOL/HDL Ratio: 4
Triglycerides: 203 mg/dL — ABNORMAL HIGH (ref 0.0–149.0)
VLDL: 40.6 mg/dL — ABNORMAL HIGH (ref 0.0–40.0)

## 2020-03-08 LAB — HEMOGLOBIN A1C: Hgb A1c MFr Bld: 5.7 % (ref 4.6–6.5)

## 2020-03-08 LAB — LDL CHOLESTEROL, DIRECT: Direct LDL: 95 mg/dL

## 2020-03-08 LAB — PSA: PSA: 0.74 ng/mL (ref 0.10–4.00)

## 2020-03-08 NOTE — Assessment & Plan Note (Addendum)
Family history of hypertension. BP significantly elevated today, he admits to a poor diet, more sedentary lifestyle. Asymptomatic.   We only have one reading of elevated BP, so I recommended that he start monitoring at home and notify if he runs at or above 130/90 consistently. I recommended he come back in 2-3 months for BP check.

## 2020-03-08 NOTE — Patient Instructions (Signed)
Stop by the lab prior to leaving today. I will notify you of your results once received.   You will be contacted regarding your referral to GI for the colonoscopy.  Please let us know if you have not been contacted within two weeks.   Start monitoring your blood pressure daily, around the same time of day, for the next 2-3 weeks.  Ensure that you have rested for 30 minutes prior to checking your blood pressure. Record your readings and notify me if you see readings at or above 130/90 consistently.  It's important to improve your diet by reducing consumption of fast food, fried food, processed snack foods, sugary drinks. Increase consumption of fresh vegetables and fruits, whole grains, water.  Ensure you are drinking 64 ounces of water daily.  It was a pleasure to see you today!   DASH Eating Plan DASH stands for "Dietary Approaches to Stop Hypertension." The DASH eating plan is a healthy eating plan that has been shown to reduce high blood pressure (hypertension). It may also reduce your risk for type 2 diabetes, heart disease, and stroke. The DASH eating plan may also help with weight loss. What are tips for following this plan?  General guidelines  Avoid eating more than 2,300 mg (milligrams) of salt (sodium) a day. If you have hypertension, you may need to reduce your sodium intake to 1,500 mg a day.  Limit alcohol intake to no more than 1 drink a day for nonpregnant women and 2 drinks a day for men. One drink equals 12 oz of beer, 5 oz of wine, or 1 oz of hard liquor.  Work with your health care provider to maintain a healthy body weight or to lose weight. Ask what an ideal weight is for you.  Get at least 30 minutes of exercise that causes your heart to beat faster (aerobic exercise) most days of the week. Activities may include walking, swimming, or biking.  Work with your health care provider or diet and nutrition specialist (dietitian) to adjust your eating plan to your  individual calorie needs. Reading food labels   Check food labels for the amount of sodium per serving. Choose foods with less than 5 percent of the Daily Value of sodium. Generally, foods with less than 300 mg of sodium per serving fit into this eating plan.  To find whole grains, look for the word "whole" as the first word in the ingredient list. Shopping  Buy products labeled as "low-sodium" or "no salt added."  Buy fresh foods. Avoid canned foods and premade or frozen meals. Cooking  Avoid adding salt when cooking. Use salt-free seasonings or herbs instead of table salt or sea salt. Check with your health care provider or pharmacist before using salt substitutes.  Do not fry foods. Cook foods using healthy methods such as baking, boiling, grilling, and broiling instead.  Cook with heart-healthy oils, such as olive, canola, soybean, or sunflower oil. Meal planning  Eat a balanced diet that includes: ? 5 or more servings of fruits and vegetables each day. At each meal, try to fill half of your plate with fruits and vegetables. ? Up to 6-8 servings of whole grains each day. ? Less than 6 oz of lean meat, poultry, or fish each day. A 3-oz serving of meat is about the same size as a deck of cards. One egg equals 1 oz. ? 2 servings of low-fat dairy each day. ? A serving of nuts, seeds, or beans 5 times each week. ?  Heart-healthy fats. Healthy fats called Omega-3 fatty acids are found in foods such as flaxseeds and coldwater fish, like sardines, salmon, and mackerel.  Limit how much you eat of the following: ? Canned or prepackaged foods. ? Food that is high in trans fat, such as fried foods. ? Food that is high in saturated fat, such as fatty meat. ? Sweets, desserts, sugary drinks, and other foods with added sugar. ? Full-fat dairy products.  Do not salt foods before eating.  Try to eat at least 2 vegetarian meals each week.  Eat more home-cooked food and less restaurant,  buffet, and fast food.  When eating at a restaurant, ask that your food be prepared with less salt or no salt, if possible. What foods are recommended? The items listed may not be a complete list. Talk with your dietitian about what dietary choices are best for you. Grains Whole-grain or whole-wheat bread. Whole-grain or whole-wheat pasta. Brown rice. [place]. Bulgur. Whole-grain and low-sodium cereals. Pita bread. Low-fat, low-sodium crackers. Whole-wheat flour tortillas. Vegetables Fresh or frozen vegetables (raw, steamed, roasted, or grilled). Low-sodium or reduced-sodium tomato and vegetable juice. Low-sodium or reduced-sodium tomato sauce and tomato paste. Low-sodium or reduced-sodium canned vegetables. Fruits All fresh, dried, or frozen fruit. Canned fruit in natural juice (without added sugar). Meat and other protein foods Skinless chicken or [place]. Ground chicken or [place]. Pork with fat trimmed off. Fish and seafood. Egg whites. Dried beans, peas, or lentils. Unsalted nuts, nut butters, and seeds. Unsalted canned beans. Lean cuts of beef with fat trimmed off. Low-sodium, lean deli meat. Dairy Low-fat (1%) or fat-free (skim) milk. Fat-free, low-fat, or reduced-fat cheeses. Nonfat, low-sodium ricotta or cottage cheese. Low-fat or nonfat yogurt. Low-fat, low-sodium cheese. Fats and oils Soft margarine without trans fats. Vegetable oil. Low-fat, reduced-fat, or light mayonnaise and salad dressings (reduced-sodium). Canola, safflower, olive, soybean, and sunflower oils. Avocado. Seasoning and other foods Herbs. Spices. Seasoning mixes without salt. Unsalted popcorn and pretzels. Fat-free sweets. What foods are not recommended? The items listed may not be a complete list. Talk with your dietitian about what dietary choices are best for you. Grains Baked goods made with fat, such as croissants, muffins, or some breads. Dry pasta or rice meal packs. Vegetables Creamed or fried  vegetables. Vegetables in a cheese sauce. Regular canned vegetables (not low-sodium or reduced-sodium). Regular canned tomato sauce and paste (not low-sodium or reduced-sodium). Regular tomato and vegetable juice (not low-sodium or reduced-sodium). Angie Fava. Olives. Fruits Canned fruit in a light or heavy syrup. Fried fruit. Fruit in cream or butter sauce. Meat and other protein foods Fatty cuts of meat. Ribs. Fried meat. Berniece Salines. Sausage. Bologna and other processed lunch meats. Salami. Fatback. Hotdogs. Bratwurst. Salted nuts and seeds. Canned beans with added salt. Canned or smoked fish. Whole eggs or egg yolks. Chicken or [place] with skin. Dairy Whole or 2% milk, cream, and half-and-half. Whole or full-fat cream cheese. Whole-fat or sweetened yogurt. Full-fat cheese. Nondairy creamers. Whipped toppings. Processed cheese and cheese spreads. Fats and oils Butter. Stick margarine. Lard. Shortening. Ghee. Bacon fat. Tropical oils, such as coconut, palm kernel, or palm oil. Seasoning and other foods Salted popcorn and pretzels. Onion salt, garlic salt, seasoned salt, table salt, and sea salt. Worcestershire sauce. Tartar sauce. Barbecue sauce. Teriyaki sauce. Soy sauce, including reduced-sodium. Steak sauce. Canned and packaged gravies. Fish sauce. Oyster sauce. Cocktail sauce. Horseradish that you find on the shelf. Ketchup. Mustard. Meat flavorings and tenderizers. Bouillon cubes. Hot sauce and Tabasco sauce.  Premade or packaged marinades. Premade or packaged taco seasonings. Relishes. Regular salad dressings. Where to find more information:  National Heart, Lung, and [place]: https://wilson-eaton.com/  American Heart Association: www.heart.org Summary  The DASH eating plan is a healthy eating plan that has been shown to reduce high blood pressure (hypertension). It may also reduce your risk for type 2 diabetes, heart disease, and stroke.  With the DASH eating plan, you should limit salt (sodium)  intake to 2,300 mg a day. If you have hypertension, you may need to reduce your sodium intake to 1,500 mg a day.  When on the DASH eating plan, aim to eat more fresh fruits and vegetables, whole grains, lean proteins, low-fat dairy, and heart-healthy fats.  Work with your health care provider or diet and nutrition specialist (dietitian) to adjust your eating plan to your individual calorie needs. This information is not intended to replace advice given to you by your health care provider. Make sure you discuss any questions you have with your health care provider. Document Revised: 08/13/2017 Document Reviewed: 08/24/2016 Elsevier Patient Education  2020 [employer].

## 2020-03-08 NOTE — Assessment & Plan Note (Signed)
Immunizations UTD, declines Shingles vaccine. PSA pending. Colonoscopy due, orders placed to GI. Encouraged a healthy diet, regular exercise. Exam today unremarkable. Labs pending.

## 2020-03-08 NOTE — Progress Notes (Signed)
Subjective:    Patient ID: Keith Mills, male    DOB: October 17, 1970, 49 y.o.   MRN: [id number]  HPI  This visit occurred during the SARS-CoV-2 public health emergency.  Safety protocols were in place, including screening questions prior to the visit, additional usage of staff PPE, and extensive cleaning of exam room while observing appropriate contact time as indicated for disinfecting solutions.   Keith Mills is a 49 year old male who presents today for complete physical.  Immunizations: -Tetanus: Completed in 2018 -Influenza: Completed last season  -Shingles: Never completed  -Covid-19: Completed series, does not have the dates.   Diet: He endorses a poor diet.  Exercise: He is not exercising.  Eye exam: Completed in 2020 Dental exam: Completes semi-annually   Colonoscopy: Never completed  PSA: Due Hep C Screen: Due  BP Readings from Last 3 Encounters:  03/08/20 (!) 168/92  03/07/19 136/82  02/28/18 138/84     Review of Systems  Constitutional: Negative for unexpected weight change.  HENT: Negative for rhinorrhea.   Respiratory: Negative for cough and shortness of breath.   Cardiovascular: Negative for chest pain.  Gastrointestinal: Negative for constipation and diarrhea.  Genitourinary: Negative for difficulty urinating.  Musculoskeletal: Positive for arthralgias.  Skin: Negative for rash.  Allergic/Immunologic: Negative for environmental allergies.  Neurological: Negative for dizziness, numbness and headaches.  Psychiatric/Behavioral: The patient is not nervous/anxious.        No past medical history on file.   Social History   Socioeconomic History  . Marital status: Married    Spouse name: Not on file  . Number of children: Not on file  . Years of education: Not on file  . Highest education level: Not on file  Occupational History  . Not on file  Tobacco Use  . Smoking status: Never Smoker  . Smokeless tobacco: Never Used  Substance and Sexual  Activity  . Alcohol use: Yes  . Drug use: Not on file  . Sexual activity: Not on file  Other Topics Concern  . Not on file  Social History Narrative   Married.   2 children.   Works as a Armed forces operational officer.   Enjoys playing sports with his children, exercising.    Social Determinants of Health   Financial Resource Strain:   . Difficulty of Paying Living Expenses:   Food Insecurity:   . Worried About Charity fundraiser in the Last Year:   . Arboriculturist in the Last Year:   Transportation Needs:   . Film/video editor (Medical):   Marland Kitchen Lack of Transportation (Non-Medical):   Physical Activity:   . Days of Exercise per Week:   . Minutes of Exercise per Session:   Stress:   . Feeling of Stress :   Social Connections:   . Frequency of Communication with Friends and Family:   . Frequency of Social Gatherings with Friends and Family:   . Attends Religious Services:   . Active Member of Clubs or Organizations:   . Attends Archivist Meetings:   Marland Kitchen Marital Status:   Intimate Partner Violence:   . Fear of Current or Ex-Partner:   . Emotionally Abused:   Marland Kitchen Physically Abused:   . Sexually Abused:     No past surgical history on file.  Family History  Problem Relation Age of Onset  . Kidney failure Mother   . Hypertension Mother   . Breast cancer Mother   . Prostate cancer Father  82  . Heart disease Father   . Hypertension Sister   . Colon polyps Sister     No Known Allergies  No current outpatient medications on file prior to visit.   No current facility-administered medications on file prior to visit.    BP (!) 168/92 (BP Location: Right Arm, Patient Position: Sitting, Cuff Size: Normal)   Pulse 87   Temp (!) 96.9 F (36.1 C) (Temporal)   Ht 5' 6.75" (1.695 m)   Wt 232 lb 4 oz (105.3 kg)   SpO2 97%   BMI 36.65 kg/m    Objective:   Physical Exam  Constitutional: He is oriented to person, place, and time.  HENT:  Right Ear: Tympanic membrane  and ear canal normal.  Left Ear: Tympanic membrane and ear canal normal.  Eyes: Pupils are equal, round, and reactive to light.  Cardiovascular: Normal rate and regular rhythm.  Respiratory: Effort normal and breath sounds normal.  GI: Soft. Bowel sounds are normal. There is no abdominal tenderness.  Musculoskeletal:        General: Normal range of motion.     Cervical back: Neck supple.  Neurological: He is alert and oriented to person, place, and time. No cranial nerve deficit.  Reflex Scores:      Patellar reflexes are 2+ on the right side and 2+ on the left side. Skin: Skin is warm and dry.  Psychiatric: Mood normal.           Assessment & Plan:

## 2020-10-03 DIAGNOSIS — Z7251 High risk heterosexual behavior: Secondary | ICD-10-CM | POA: Diagnosis not present

## 2020-10-22 ENCOUNTER — Ambulatory Visit: Payer: 59 | Admitting: Primary Care

## 2020-10-22 ENCOUNTER — Other Ambulatory Visit: Payer: Self-pay

## 2020-10-22 ENCOUNTER — Encounter: Payer: Self-pay | Admitting: Primary Care

## 2020-10-22 VITALS — BP 134/94 | HR 71 | Temp 98.1°F | Ht 66.75 in | Wt 228.0 lb

## 2020-10-22 DIAGNOSIS — I10 Essential (primary) hypertension: Secondary | ICD-10-CM

## 2020-10-22 DIAGNOSIS — R3912 Poor urinary stream: Secondary | ICD-10-CM | POA: Diagnosis not present

## 2020-10-22 DIAGNOSIS — R351 Nocturia: Secondary | ICD-10-CM

## 2020-10-22 LAB — POC URINALSYSI DIPSTICK (AUTOMATED)
Bilirubin, UA: NEGATIVE
Glucose, UA: NEGATIVE
Ketones, UA: NEGATIVE
Leukocytes, UA: NEGATIVE
Nitrite, UA: NEGATIVE
Protein, UA: POSITIVE — AB
Spec Grav, UA: 1.025 (ref 1.010–1.025)
Urobilinogen, UA: 0.2 E.U./dL
pH, UA: 6 (ref 5.0–8.0)

## 2020-10-22 LAB — PSA: PSA: 1.75 ng/mL (ref 0.10–4.00)

## 2020-10-22 LAB — CBC
HCT: 41.5 % (ref 39.0–52.0)
Hemoglobin: 14 g/dL (ref 13.0–17.0)
MCHC: 33.7 g/dL (ref 30.0–36.0)
MCV: 89.1 fl (ref 78.0–100.0)
Platelets: 216 10*3/uL (ref 150.0–400.0)
RBC: 4.65 Mil/uL (ref 4.22–5.81)
RDW: 13.7 % (ref 11.5–15.5)
WBC: 3.6 10*3/uL — ABNORMAL LOW (ref 4.0–10.5)

## 2020-10-22 LAB — BASIC METABOLIC PANEL
BUN: 20 mg/dL (ref 6–23)
CO2: 28 mEq/L (ref 19–32)
Calcium: 9.6 mg/dL (ref 8.4–10.5)
Chloride: 105 mEq/L (ref 96–112)
Creatinine, Ser: 1.47 mg/dL (ref 0.40–1.50)
GFR: 55.14 mL/min — ABNORMAL LOW (ref >60.00)
Glucose, Bld: 107 mg/dL — ABNORMAL HIGH (ref 70–99)
Potassium: 4.3 mEq/L (ref 3.5–5.1)
Sodium: 138 mEq/L (ref 135–145)

## 2020-10-22 LAB — HEMOGLOBIN A1C: Hgb A1c MFr Bld: 5.6 % (ref 4.6–6.5)

## 2020-10-22 NOTE — Progress Notes (Signed)
Subjective:    Patient ID: Keith Mills, male    DOB: 1971-05-26, 50 y.o.   MRN: [id number]  HPI  This visit occurred during the SARS-CoV-2 public health emergency.  Safety protocols were in place, including screening questions prior to the visit, additional usage of staff PPE, and extensive cleaning of exam room while observing appropriate contact time as indicated for disinfecting solutions.   Keith Mills is a 50 year old male with a history of elevated blood pressure reading who presents today for follow up of blood pressure and to discuss nocturia/weak urinary stream.  He was last evaluated in June 2021 for CPE, BP was noted to be quite high, no prior history of hypertension but strong FH of hypertension. He was advised to monitor BP at home and notify if he saw readings consistently at or above 130/90. We also recommended he return 2-3 weeks later for BP check.   Today he endorses that he's been working on improving his diet since January 2022. He's increased vegetables, fruit, lean protein. He's cut out caffeine and is working to reduce stress. He has been exercising five days weekly. He has not been checking his BP regularly, last check was three weeks ago at Urgent Care which was 160/100, this "got my attention".   He denies headaches, chest pain, dizziness.   He has noticed a weak urinary stream, cloudy appearance to urine, slow starting stream when he wakes in the morning. He is getting up to urinate more frequently at night, typically twice nightly now which is a change from before. All symptoms began in early 2022. He presented to an UC three weeks ago, tested negative for STD's and infection.   BP Readings from Last 3 Encounters:  10/22/20 (!) 134/94  03/08/20 (!) 172/96  03/07/19 136/82   Wt Readings from Last 3 Encounters:  10/22/20 228 lb (103.4 kg)  03/08/20 232 lb 4 oz (105.3 kg)  03/07/19 220 lb 4 oz (99.9 kg)     Review of Systems  Eyes: Negative for visual  disturbance.  Respiratory: Negative for shortness of breath.   Cardiovascular: Negative for chest pain.  Genitourinary: Negative for dysuria and hematuria.       Nocturia, weak urinary stream   Neurological: Negative for dizziness.       History reviewed. No pertinent past medical history.   Social History   Socioeconomic History  . Marital status: Married    Spouse name: Not on file  . Number of children: Not on file  . Years of education: Not on file  . Highest education level: Not on file  Occupational History  . Not on file  Tobacco Use  . Smoking status: Never Smoker  . Smokeless tobacco: Never Used  Substance and Sexual Activity  . Alcohol use: Yes  . Drug use: Not on file  . Sexual activity: Not on file  Other Topics Concern  . Not on file  Social History Narrative   Married.   2 children.   Works as a Armed forces operational officer.   Enjoys playing sports with his children, exercising.    Social Determinants of Health   Financial Resource Strain: Not on file  Food Insecurity: Not on file  Transportation Needs: Not on file  Physical Activity: Not on file  Stress: Not on file  Social Connections: Not on file  Intimate Partner Violence: Not on file    History reviewed. No pertinent surgical history.  Family History  Problem Relation Age of  Onset  . Kidney failure Mother   . Hypertension Mother   . Breast cancer Mother   . Prostate cancer Father 66  . Heart disease Father   . Hypertension Sister   . Colon polyps Sister     No Known Allergies  No current outpatient medications on file prior to visit.   No current facility-administered medications on file prior to visit.    BP (!) 134/94   Pulse 71   Temp 98.1 F (36.7 C) (Temporal)   Ht 5' 6.75" (1.695 m)   Wt 228 lb (103.4 kg)   SpO2 98%   BMI 35.98 kg/m    Objective:   Physical Exam Constitutional:      Appearance: He is well-nourished.  Cardiovascular:     Rate and Rhythm: Normal rate and  regular rhythm.  Pulmonary:     Effort: Pulmonary effort is normal.     Breath sounds: Normal breath sounds.  Musculoskeletal:     Cervical back: Neck supple.  Skin:    General: Skin is warm and dry.  Psychiatric:        Mood and Affect: Mood and affect normal.            Assessment & Plan:

## 2020-10-22 NOTE — Patient Instructions (Signed)
Stop by the lab prior to leaving today. I will notify you of your results once received.   Continue exercising. You should be getting 150 minutes of moderate intensity exercise weekly.  Continue to work on a healthy diet.   Please schedule a follow up visit to meet back with me in 3-4 weeks for blood pressure check.   It was a pleasure to see you today!   [email or web address]">  DASH Eating Plan DASH stands for Dietary Approaches to Stop Hypertension. The DASH eating plan is a healthy eating plan that has been shown to:  Reduce high blood pressure (hypertension).  Reduce your risk for type 2 diabetes, heart disease, and stroke.  Help with weight loss. What are tips for following this plan? Reading food labels  Check food labels for the amount of salt (sodium) per serving. Choose foods with less than 5 percent of the Daily Value of sodium. Generally, foods with less than 300 milligrams (mg) of sodium per serving fit into this eating plan.  To find whole grains, look for the word "whole" as the first word in the ingredient list. Shopping  Buy products labeled as "low-sodium" or "no salt added."  Buy fresh foods. Avoid canned foods and pre-made or frozen meals. Cooking  Avoid adding salt when cooking. Use salt-free seasonings or herbs instead of table salt or sea salt. Check with your health care provider or pharmacist before using salt substitutes.  Do not fry foods. Cook foods using healthy methods such as baking, boiling, grilling, roasting, and broiling instead.  Cook with heart-healthy oils, such as olive, canola, avocado, soybean, or sunflower oil. Meal planning  Eat a balanced diet that includes: ? 4 or more servings of fruits and 4 or more servings of vegetables each day. Try to fill one-half of your plate with fruits and vegetables. ? 6-8 servings of whole grains each day. ? Less than 6 oz (170 g) of lean meat, poultry, or  fish each day. A 3-oz (85-g) serving of meat is about the same size as a deck of cards. One egg equals 1 oz (28 g). ? 2-3 servings of low-fat dairy each day. One serving is 1 cup (237 mL). ? 1 serving of nuts, seeds, or beans 5 times each week. ? 2-3 servings of heart-healthy fats. Healthy fats called omega-3 fatty acids are found in foods such as walnuts, flaxseeds, fortified milks, and eggs. These fats are also found in cold-water fish, such as sardines, salmon, and mackerel.  Limit how much you eat of: ? Canned or prepackaged foods. ? Food that is high in trans fat, such as some fried foods. ? Food that is high in saturated fat, such as fatty meat. ? Desserts and other sweets, sugary drinks, and other foods with added sugar. ? Full-fat dairy products.  Do not salt foods before eating.  Do not eat more than 4 egg yolks a week.  Try to eat at least 2 vegetarian meals a week.  Eat more home-cooked food and less restaurant, buffet, and fast food.   Lifestyle  When eating at a restaurant, ask that your food be prepared with less salt or no salt, if possible.  If you drink alcohol: ? Limit how much you use to:  0-1 drink a day for women who are not pregnant.  0-2 drinks a day for men. ? Be aware of how much alcohol is in your drink. In the [place] one drink equals one 12 oz bottle of beer (355 mL),  one 5 oz glass of wine (148 mL), or one 1 oz glass of hard liquor (44 mL). General information  Avoid eating more than 2,300 mg of salt a day. If you have hypertension, you may need to reduce your sodium intake to 1,500 mg a day.  Work with your health care provider to maintain a healthy body weight or to lose weight. Ask what an ideal weight is for you.  Get at least 30 minutes of exercise that causes your heart to beat faster (aerobic exercise) most days of the week. Activities may include walking, swimming, or biking.  Work with your health care provider or dietitian to adjust your  eating plan to your individual calorie needs. What foods should I eat? Fruits All fresh, dried, or frozen fruit. Canned fruit in natural juice (without added sugar). Vegetables Fresh or frozen vegetables (raw, steamed, roasted, or grilled). Low-sodium or reduced-sodium tomato and vegetable juice. Low-sodium or reduced-sodium tomato sauce and tomato paste. Low-sodium or reduced-sodium canned vegetables. Grains Whole-grain or whole-wheat bread. Whole-grain or whole-wheat pasta. Brown rice. [place]. Bulgur. Whole-grain and low-sodium cereals. Pita bread. Low-fat, low-sodium crackers. Whole-wheat flour tortillas. Meats and other proteins Skinless chicken or [place]. Ground chicken or [place]. Pork with fat trimmed off. Fish and seafood. Egg whites. Dried beans, peas, or lentils. Unsalted nuts, nut butters, and seeds. Unsalted canned beans. Lean cuts of beef with fat trimmed off. Low-sodium, lean precooked or cured meat, such as sausages or meat loaves. Dairy Low-fat (1%) or fat-free (skim) milk. Reduced-fat, low-fat, or fat-free cheeses. Nonfat, low-sodium ricotta or cottage cheese. Low-fat or nonfat yogurt. Low-fat, low-sodium cheese. Fats and oils Soft margarine without trans fats. Vegetable oil. Reduced-fat, low-fat, or light mayonnaise and salad dressings (reduced-sodium). Canola, safflower, olive, avocado, soybean, and sunflower oils. Avocado. Seasonings and condiments Herbs. Spices. Seasoning mixes without salt. Other foods Unsalted popcorn and pretzels. Fat-free sweets. The items listed above may not be a complete list of foods and beverages you can eat. Contact a dietitian for more information. What foods should I avoid? Fruits Canned fruit in a light or heavy syrup. Fried fruit. Fruit in cream or butter sauce. Vegetables Creamed or fried vegetables. Vegetables in a cheese sauce. Regular canned vegetables (not low-sodium or reduced-sodium). Regular canned tomato sauce and paste (not  low-sodium or reduced-sodium). Regular tomato and vegetable juice (not low-sodium or reduced-sodium). Angie Fava. Olives. Grains Baked goods made with fat, such as croissants, muffins, or some breads. Dry pasta or rice meal packs. Meats and other proteins Fatty cuts of meat. Ribs. Fried meat. Berniece Salines. Bologna, salami, and other precooked or cured meats, such as sausages or meat loaves. Fat from the back of a pig (fatback). Bratwurst. Salted nuts and seeds. Canned beans with added salt. Canned or smoked fish. Whole eggs or egg yolks. Chicken or [place] with skin. Dairy Whole or 2% milk, cream, and half-and-half. Whole or full-fat cream cheese. Whole-fat or sweetened yogurt. Full-fat cheese. Nondairy creamers. Whipped toppings. Processed cheese and cheese spreads. Fats and oils Butter. Stick margarine. Lard. Shortening. Ghee. Bacon fat. Tropical oils, such as coconut, palm kernel, or palm oil. Seasonings and condiments Onion salt, garlic salt, seasoned salt, table salt, and sea salt. Worcestershire sauce. Tartar sauce. Barbecue sauce. Teriyaki sauce. Soy sauce, including reduced-sodium. Steak sauce. Canned and packaged gravies. Fish sauce. Oyster sauce. Cocktail sauce. Store-bought horseradish. Ketchup. Mustard. Meat flavorings and tenderizers. Bouillon cubes. Hot sauces. Pre-made or packaged marinades. Pre-made or packaged taco seasonings. Relishes. Regular salad dressings. Other foods Salted  popcorn and pretzels. The items listed above may not be a complete list of foods and beverages you should avoid. Contact a dietitian for more information. Where to find more information  National Heart, Lung, and Blood Institute: https://wilson-eaton.com/  American Heart Association: www.heart.org  Academy of Nutrition and Dietetics: www.eatright.[place]: www.kidney.org Summary  The DASH eating plan is a healthy eating plan that has been shown to reduce high blood pressure (hypertension). It  may also reduce your risk for type 2 diabetes, heart disease, and stroke.  When on the DASH eating plan, aim to eat more fresh fruits and vegetables, whole grains, lean proteins, low-fat dairy, and heart-healthy fats.  With the DASH eating plan, you should limit salt (sodium) intake to 2,300 mg a day. If you have hypertension, you may need to reduce your sodium intake to 1,500 mg a day.  Work with your health care provider or dietitian to adjust your eating plan to your individual calorie needs. This information is not intended to replace advice given to you by your health care provider. Make sure you discuss any questions you have with your health care provider. Document Revised: 08/04/2019 Document Reviewed: 08/04/2019 Elsevier Patient Education  2021 [employer].

## 2020-10-22 NOTE — Assessment & Plan Note (Signed)
Above goal in the office today, also with recent urgent care reading of "160/100" three weeks ago.   Asymptomatic. Discussed asymptomatic nature of hypertension. He is very motivated to reduce BP naturally without use of medications.   He would like another 3-4 weeks to work on dietary changes and exercise and would like to return for follow up. He will set up a follow up visit for 3-4 weeks.

## 2020-10-22 NOTE — Assessment & Plan Note (Signed)
Also with nocturia and other LUTS. Suspect mild BPH, checking PSA today .  UA today with trace leuks, no infection.  Culture sent.   Checking labs including A1C, CBC, BMP, PSA

## 2020-10-23 LAB — URINE CULTURE
MICRO NUMBER:: 11508641
Result:: NO GROWTH
SPECIMEN QUALITY:: ADEQUATE

## 2020-10-25 ENCOUNTER — Other Ambulatory Visit: Payer: Self-pay

## 2020-10-25 MED ORDER — TAMSULOSIN HCL 0.4 MG PO CAPS: 0.4000 mg | ORAL_CAPSULE | Freq: Every day | ORAL | 1 refills | Status: DC

## 2020-11-13 ENCOUNTER — Other Ambulatory Visit (HOSPITAL_COMMUNITY): Payer: Self-pay | Admitting: Primary Care

## 2020-11-13 ENCOUNTER — Other Ambulatory Visit: Payer: Self-pay

## 2020-11-13 ENCOUNTER — Encounter: Payer: Self-pay | Admitting: Primary Care

## 2020-11-13 ENCOUNTER — Ambulatory Visit: Payer: 59 | Admitting: Primary Care

## 2020-11-13 VITALS — BP 144/98 | HR 74 | Temp 97.5°F | Ht 66.75 in | Wt 230.0 lb

## 2020-11-13 DIAGNOSIS — R3912 Poor urinary stream: Secondary | ICD-10-CM

## 2020-11-13 DIAGNOSIS — R351 Nocturia: Secondary | ICD-10-CM

## 2020-11-13 DIAGNOSIS — I10 Essential (primary) hypertension: Secondary | ICD-10-CM

## 2020-11-13 MED ORDER — AMLODIPINE BESYLATE 5 MG PO TABS: 5.0000 mg | ORAL_TABLET | Freq: Every day | ORAL | 0 refills | Status: DC

## 2020-11-13 MED ORDER — TAMSULOSIN HCL 0.4 MG PO CAPS: 0.4000 mg | ORAL_CAPSULE | Freq: Every day | ORAL | 3 refills | Status: DC

## 2020-11-13 NOTE — Patient Instructions (Signed)
Continue tamsulosin for urinary stream.  Start amlodipine 5 mg once daily for blood pressure.  Please schedule a follow up visit to meet back with me in 2-3 weeks for blood pressure check.   It was a pleasure to see you today!   [email or web address]">  DASH Eating Plan DASH stands for Dietary Approaches to Stop Hypertension. The DASH eating plan is a healthy eating plan that has been shown to:  Reduce high blood pressure (hypertension).  Reduce your risk for type 2 diabetes, heart disease, and stroke.  Help with weight loss. What are tips for following this plan? Reading food labels  Check food labels for the amount of salt (sodium) per serving. Choose foods with less than 5 percent of the Daily Value of sodium. Generally, foods with less than 300 milligrams (mg) of sodium per serving fit into this eating plan.  To find whole grains, look for the word "whole" as the first word in the ingredient list. Shopping  Buy products labeled as "low-sodium" or "no salt added."  Buy fresh foods. Avoid canned foods and pre-made or frozen meals. Cooking  Avoid adding salt when cooking. Use salt-free seasonings or herbs instead of table salt or sea salt. Check with your health care provider or pharmacist before using salt substitutes.  Do not fry foods. Cook foods using healthy methods such as baking, boiling, grilling, roasting, and broiling instead.  Cook with heart-healthy oils, such as olive, canola, avocado, soybean, or sunflower oil. Meal planning  Eat a balanced diet that includes: ? 4 or more servings of fruits and 4 or more servings of vegetables each day. Try to fill one-half of your plate with fruits and vegetables. ? 6-8 servings of whole grains each day. ? Less than 6 oz (170 g) of lean meat, poultry, or fish each day. A 3-oz (85-g) serving of meat is about the same size as a deck of cards. One egg equals 1 oz (28 g). ? 2-3 servings  of low-fat dairy each day. One serving is 1 cup (237 mL). ? 1 serving of nuts, seeds, or beans 5 times each week. ? 2-3 servings of heart-healthy fats. Healthy fats called omega-3 fatty acids are found in foods such as walnuts, flaxseeds, fortified milks, and eggs. These fats are also found in cold-water fish, such as sardines, salmon, and mackerel.  Limit how much you eat of: ? Canned or prepackaged foods. ? Food that is high in trans fat, such as some fried foods. ? Food that is high in saturated fat, such as fatty meat. ? Desserts and other sweets, sugary drinks, and other foods with added sugar. ? Full-fat dairy products.  Do not salt foods before eating.  Do not eat more than 4 egg yolks a week.  Try to eat at least 2 vegetarian meals a week.  Eat more home-cooked food and less restaurant, buffet, and fast food.   Lifestyle  When eating at a restaurant, ask that your food be prepared with less salt or no salt, if possible.  If you drink alcohol: ? Limit how much you use to:  0-1 drink a day for women who are not pregnant.  0-2 drinks a day for men. ? Be aware of how much alcohol is in your drink. In the [place] one drink equals one 12 oz bottle of beer (355 mL), one 5 oz glass of wine (148 mL), or one 1 oz glass of hard liquor (44 mL). General information  Avoid eating more than 2,300  mg of salt a day. If you have hypertension, you may need to reduce your sodium intake to 1,500 mg a day.  Work with your health care provider to maintain a healthy body weight or to lose weight. Ask what an ideal weight is for you.  Get at least 30 minutes of exercise that causes your heart to beat faster (aerobic exercise) most days of the week. Activities may include walking, swimming, or biking.  Work with your health care provider or dietitian to adjust your eating plan to your individual calorie needs. What foods should I eat? Fruits All fresh, dried, or frozen fruit. Canned fruit in  natural juice (without added sugar). Vegetables Fresh or frozen vegetables (raw, steamed, roasted, or grilled). Low-sodium or reduced-sodium tomato and vegetable juice. Low-sodium or reduced-sodium tomato sauce and tomato paste. Low-sodium or reduced-sodium canned vegetables. Grains Whole-grain or whole-wheat bread. Whole-grain or whole-wheat pasta. Brown rice. [place]. Bulgur. Whole-grain and low-sodium cereals. Pita bread. Low-fat, low-sodium crackers. Whole-wheat flour tortillas. Meats and other proteins Skinless chicken or [place]. Ground chicken or [place]. Pork with fat trimmed off. Fish and seafood. Egg whites. Dried beans, peas, or lentils. Unsalted nuts, nut butters, and seeds. Unsalted canned beans. Lean cuts of beef with fat trimmed off. Low-sodium, lean precooked or cured meat, such as sausages or meat loaves. Dairy Low-fat (1%) or fat-free (skim) milk. Reduced-fat, low-fat, or fat-free cheeses. Nonfat, low-sodium ricotta or cottage cheese. Low-fat or nonfat yogurt. Low-fat, low-sodium cheese. Fats and oils Soft margarine without trans fats. Vegetable oil. Reduced-fat, low-fat, or light mayonnaise and salad dressings (reduced-sodium). Canola, safflower, olive, avocado, soybean, and sunflower oils. Avocado. Seasonings and condiments Herbs. Spices. Seasoning mixes without salt. Other foods Unsalted popcorn and pretzels. Fat-free sweets. The items listed above may not be a complete list of foods and beverages you can eat. Contact a dietitian for more information. What foods should I avoid? Fruits Canned fruit in a light or heavy syrup. Fried fruit. Fruit in cream or butter sauce. Vegetables Creamed or fried vegetables. Vegetables in a cheese sauce. Regular canned vegetables (not low-sodium or reduced-sodium). Regular canned tomato sauce and paste (not low-sodium or reduced-sodium). Regular tomato and vegetable juice (not low-sodium or reduced-sodium). Angie Fava.  Olives. Grains Baked goods made with fat, such as croissants, muffins, or some breads. Dry pasta or rice meal packs. Meats and other proteins Fatty cuts of meat. Ribs. Fried meat. Berniece Salines. Bologna, salami, and other precooked or cured meats, such as sausages or meat loaves. Fat from the back of a pig (fatback). Bratwurst. Salted nuts and seeds. Canned beans with added salt. Canned or smoked fish. Whole eggs or egg yolks. Chicken or [place] with skin. Dairy Whole or 2% milk, cream, and half-and-half. Whole or full-fat cream cheese. Whole-fat or sweetened yogurt. Full-fat cheese. Nondairy creamers. Whipped toppings. Processed cheese and cheese spreads. Fats and oils Butter. Stick margarine. Lard. Shortening. Ghee. Bacon fat. Tropical oils, such as coconut, palm kernel, or palm oil. Seasonings and condiments Onion salt, garlic salt, seasoned salt, table salt, and sea salt. Worcestershire sauce. Tartar sauce. Barbecue sauce. Teriyaki sauce. Soy sauce, including reduced-sodium. Steak sauce. Canned and packaged gravies. Fish sauce. Oyster sauce. Cocktail sauce. Store-bought horseradish. Ketchup. Mustard. Meat flavorings and tenderizers. Bouillon cubes. Hot sauces. Pre-made or packaged marinades. Pre-made or packaged taco seasonings. Relishes. Regular salad dressings. Other foods Salted popcorn and pretzels. The items listed above may not be a complete list of foods and beverages you should avoid. Contact a dietitian for more information.  Where to find more information  National Heart, Lung, and Blood Institute: https://wilson-eaton.com/  American Heart Association: www.heart.org  Academy of Nutrition and Dietetics: www.eatright.[place]: www.kidney.org Summary  The DASH eating plan is a healthy eating plan that has been shown to reduce high blood pressure (hypertension). It may also reduce your risk for type 2 diabetes, heart disease, and stroke.  When on the DASH eating plan, aim to  eat more fresh fruits and vegetables, whole grains, lean proteins, low-fat dairy, and heart-healthy fats.  With the DASH eating plan, you should limit salt (sodium) intake to 2,300 mg a day. If you have hypertension, you may need to reduce your sodium intake to 1,500 mg a day.  Work with your health care provider or dietitian to adjust your eating plan to your individual calorie needs. This information is not intended to replace advice given to you by your health care provider. Make sure you discuss any questions you have with your health care provider. Document Revised: 08/04/2019 Document Reviewed: 08/04/2019 Elsevier Patient Education  2021 [employer].

## 2020-11-13 NOTE — Assessment & Plan Note (Signed)
Above goal in the office today, unable to make lifestyle changes.  Long discussion regarding potential effects of uncontrolled hypertension including CKD for which his mother suffered from.  We decided to initiate treatment with amlodipine 5 mg once daily.  We will plan to see him back for blood pressure check in 2 to 3 weeks.

## 2020-11-13 NOTE — Progress Notes (Signed)
Subjective:    Patient ID: Keith Mills, male    DOB: 1971-06-02, 50 y.o.   MRN: [id number]  HPI  This visit occurred during the SARS-CoV-2 public health emergency.  Safety protocols were in place, including screening questions prior to the visit, additional usage of staff PPE, and extensive cleaning of exam room while observing appropriate contact time as indicated for disinfecting solutions.   Keith Mills is a 50 year old male with a history of hypertension, weak urinary stream who presents today for follow-up of hypertension and weak urinary stream.  He was last evaluated on 10/22/20 with reports of weak urinary stream and asymptomatic high blood pressure readings.  Work-up for UTI were negative, so we initiated tamsulosin 0.5 mg for suspected BPH.  He also wanted to work on lifestyle changes in order to lower his blood pressure naturally, so we decided to follow-up in 3 to 4 weeks for recheck.  Today he endorses that he was unable to make these lifestyle changes, has been unable to exercise and improve his diet since his last visit.  His kids are very active in sports and he has been on the road traveling a lot.  He has not checked his BP at home. He denies dizziness, headaches, chest pain.   His urinary stream has improved, he is still getting up during the night once to urinate.   BP Readings from Last 3 Encounters:  11/13/20 (!) 144/98  10/22/20 (!) 134/94  03/08/20 (!) 172/96     Review of Systems  Eyes: Negative for visual disturbance.  Respiratory: Negative for shortness of breath.   Cardiovascular: Negative for chest pain.  Neurological: Negative for dizziness and headaches.       History reviewed. No pertinent past medical history.   Social History   Socioeconomic History  . Marital status: Married    Spouse name: Not on file  . Number of children: Not on file  . Years of education: Not on file  . Highest education level: Not on file  Occupational History  . Not  on file  Tobacco Use  . Smoking status: Never Smoker  . Smokeless tobacco: Never Used  Substance and Sexual Activity  . Alcohol use: Yes  . Drug use: Not on file  . Sexual activity: Not on file  Other Topics Concern  . Not on file  Social History Narrative   Married.   2 children.   Works as a Armed forces operational officer.   Enjoys playing sports with his children, exercising.    Social Determinants of Health   Financial Resource Strain: Not on file  Food Insecurity: Not on file  Transportation Needs: Not on file  Physical Activity: Not on file  Stress: Not on file  Social Connections: Not on file  Intimate Partner Violence: Not on file    History reviewed. No pertinent surgical history.  Family History  Problem Relation Age of Onset  . Kidney failure Mother   . Hypertension Mother   . Breast cancer Mother   . Prostate cancer Father 40  . Heart disease Father   . Hypertension Sister   . Colon polyps Sister     No Known Allergies  No current outpatient medications on file prior to visit.   No current facility-administered medications on file prior to visit.    BP (!) 144/98   Pulse 74   Temp (!) 97.5 F (36.4 C) (Temporal)   Ht 5' 6.75" (1.695 m)   Wt 230 lb (104.3 kg)  SpO2 97%   BMI 36.29 kg/m    Objective:   Physical Exam Constitutional:      Appearance: He is well-nourished.  Cardiovascular:     Rate and Rhythm: Normal rate and regular rhythm.  Pulmonary:     Effort: Pulmonary effort is normal.     Breath sounds: Normal breath sounds.  Musculoskeletal:     Cervical back: Neck supple.  Skin:    General: Skin is warm and dry.  Psychiatric:        Mood and Affect: Mood and affect normal.            Assessment & Plan:

## 2020-11-13 NOTE — Assessment & Plan Note (Signed)
Improved with tamsulosin 0.4 mg.  PSA unremarkable. Continue same, refills provided.

## 2020-11-19 ENCOUNTER — Other Ambulatory Visit: Payer: Self-pay | Admitting: Primary Care

## 2020-11-19 DIAGNOSIS — I10 Essential (primary) hypertension: Secondary | ICD-10-CM

## 2020-12-04 ENCOUNTER — Ambulatory Visit: Payer: 59 | Admitting: Primary Care

## 2020-12-04 DIAGNOSIS — Z0289 Encounter for other administrative examinations: Secondary | ICD-10-CM

## 2020-12-25 ENCOUNTER — Other Ambulatory Visit (HOSPITAL_COMMUNITY): Payer: Self-pay

## 2021-01-01 ENCOUNTER — Other Ambulatory Visit: Payer: Self-pay | Admitting: Primary Care

## 2021-01-01 ENCOUNTER — Other Ambulatory Visit: Payer: Self-pay

## 2021-01-01 DIAGNOSIS — I10 Essential (primary) hypertension: Secondary | ICD-10-CM

## 2021-01-01 MED ORDER — AMLODIPINE BESYLATE 5 MG PO TABS
5.0000 mg | ORAL_TABLET | Freq: Every day | ORAL | 0 refills | Status: DC
  Filled 2021-01-01 – 2021-02-04 (×2): qty 90, 90d supply, fill #0

## 2021-01-07 ENCOUNTER — Other Ambulatory Visit (HOSPITAL_COMMUNITY): Payer: Self-pay

## 2021-01-14 ENCOUNTER — Other Ambulatory Visit: Payer: Self-pay

## 2021-02-04 ENCOUNTER — Other Ambulatory Visit: Payer: Self-pay

## 2021-02-24 ENCOUNTER — Other Ambulatory Visit: Payer: Self-pay

## 2021-02-24 MED FILL — Tamsulosin HCl Cap 0.4 MG: ORAL | 90 days supply | Qty: 90 | Fill #0 | Status: AC

## 2021-02-27 ENCOUNTER — Other Ambulatory Visit: Payer: Self-pay

## 2021-03-11 ENCOUNTER — Other Ambulatory Visit (HOSPITAL_COMMUNITY): Payer: Self-pay

## 2021-03-18 ENCOUNTER — Other Ambulatory Visit (HOSPITAL_COMMUNITY): Payer: Self-pay

## 2021-03-19 ENCOUNTER — Other Ambulatory Visit (HOSPITAL_COMMUNITY): Payer: Self-pay

## 2021-03-20 ENCOUNTER — Other Ambulatory Visit (HOSPITAL_COMMUNITY): Payer: Self-pay

## 2021-03-21 ENCOUNTER — Ambulatory Visit (INDEPENDENT_AMBULATORY_CARE_PROVIDER_SITE_OTHER): Payer: 59 | Admitting: Primary Care

## 2021-03-21 ENCOUNTER — Encounter: Payer: Self-pay | Admitting: Primary Care

## 2021-03-21 ENCOUNTER — Other Ambulatory Visit: Payer: Self-pay

## 2021-03-21 VITALS — BP 120/62 | HR 68 | Temp 98.1°F | Ht 67.0 in | Wt 224.0 lb

## 2021-03-21 DIAGNOSIS — Z1211 Encounter for screening for malignant neoplasm of colon: Secondary | ICD-10-CM

## 2021-03-21 DIAGNOSIS — Z0000 Encounter for general adult medical examination without abnormal findings: Secondary | ICD-10-CM

## 2021-03-21 DIAGNOSIS — Z23 Encounter for immunization: Secondary | ICD-10-CM

## 2021-03-21 DIAGNOSIS — I10 Essential (primary) hypertension: Secondary | ICD-10-CM

## 2021-03-21 DIAGNOSIS — Z114 Encounter for screening for human immunodeficiency virus [HIV]: Secondary | ICD-10-CM | POA: Diagnosis not present

## 2021-03-21 DIAGNOSIS — Z1159 Encounter for screening for other viral diseases: Secondary | ICD-10-CM

## 2021-03-21 DIAGNOSIS — Z8042 Family history of malignant neoplasm of prostate: Secondary | ICD-10-CM | POA: Diagnosis not present

## 2021-03-21 DIAGNOSIS — R3912 Poor urinary stream: Secondary | ICD-10-CM

## 2021-03-21 LAB — COMPREHENSIVE METABOLIC PANEL
ALT: 23 U/L (ref 0–53)
AST: 25 U/L (ref 0–37)
Albumin: 4.1 g/dL (ref 3.5–5.2)
Alkaline Phosphatase: 84 U/L (ref 39–117)
BUN: 17 mg/dL (ref 6–23)
CO2: 26 mEq/L (ref 19–32)
Calcium: 9 mg/dL (ref 8.4–10.5)
Chloride: 104 mEq/L (ref 96–112)
Creatinine, Ser: 1.42 mg/dL (ref 0.40–1.50)
GFR: 57.31 mL/min — ABNORMAL LOW (ref >60.00)
Glucose, Bld: 134 mg/dL — ABNORMAL HIGH (ref 70–99)
Potassium: 4.1 mEq/L (ref 3.5–5.1)
Sodium: 138 mEq/L (ref 135–145)
Total Bilirubin: 0.6 mg/dL (ref 0.2–1.2)
Total Protein: 7.3 g/dL (ref 6.0–8.3)

## 2021-03-21 LAB — LIPID PANEL
Cholesterol: 157 mg/dL (ref 0–200)
HDL: 49.3 mg/dL (ref >39.00)
LDL Cholesterol: 86 mg/dL (ref 0–99)
NonHDL: 107.69
Total CHOL/HDL Ratio: 3
Triglycerides: 106 mg/dL (ref 0.0–149.0)
VLDL: 21.2 mg/dL (ref 0.0–40.0)

## 2021-03-21 LAB — HEMOGLOBIN A1C: Hgb A1c MFr Bld: 5.8 % (ref 4.6–6.5)

## 2021-03-21 MED ORDER — AMLODIPINE BESYLATE 5 MG PO TABS
5.0000 mg | ORAL_TABLET | Freq: Every day | ORAL | 3 refills | Status: DC
Start: 2021-03-21 — End: 2021-06-04

## 2021-03-21 NOTE — Assessment & Plan Note (Signed)
Improved on tamsulosin 0.4 mg. Referral placed to Urology per patient preference.

## 2021-03-21 NOTE — Addendum Note (Signed)
Addended by: Francella Solian on: 03/21/2021 10:15 AM   Modules accepted: Orders

## 2021-03-21 NOTE — Progress Notes (Signed)
Subjective:    Patient ID: Keith Mills, male    DOB: 1970/10/03, 50 y.o.   MRN: [id number]  HPI  Keith Mills is a very pleasant 50 y.o. male who presents today for complete physical.  Immunizations: -Tetanus: 2018 -Covid-19: 2 vaccines -Shingles: Due today  Diet: Fair diet.  Exercise: No recent exercise   Eye exam: No recent visit  Dental exam: Completes semi-annually   Colonoscopy: Never completed, ordered.  PSA: 1.75 in February 2022 He would like to see Urology given his fathers history of prostate cancer (60's). Urinary stream has improved on tamsulosin.   Wt Readings from Last 3 Encounters:  03/21/21 224 lb (101.6 kg)  11/13/20 230 lb (104.3 kg)  10/22/20 228 lb (103.4 kg)    BP Readings from Last 3 Encounters:  03/21/21 120/62  11/13/20 (!) 144/98  10/22/20 (!) 134/94      Review of Systems  Constitutional:  Negative for unexpected weight change.  HENT:  Negative for rhinorrhea.   Respiratory:  Negative for shortness of breath.   Cardiovascular:  Negative for chest pain.  Gastrointestinal:  Negative for constipation and diarrhea.  Genitourinary:  Negative for difficulty urinating.  Musculoskeletal:  Negative for arthralgias and myalgias.  Skin:  Negative for rash.  Allergic/Immunologic: Negative for environmental allergies.  Neurological:  Negative for dizziness and headaches.  Psychiatric/Behavioral:  The patient is not nervous/anxious.         History reviewed. No pertinent past medical history.  Social History   Socioeconomic History   Marital status: Married    Spouse name: Not on file   Number of children: Not on file   Years of education: Not on file   Highest education level: Not on file  Occupational History   Not on file  Tobacco Use   Smoking status: Never   Smokeless tobacco: Never  Substance and Sexual Activity   Alcohol use: Yes   Drug use: Not on file   Sexual activity: Not on file  Other Topics Concern   Not on file   Social History Narrative   Married.   2 children.   Works as a Armed forces operational officer.   Enjoys playing sports with his children, exercising.    Social Determinants of Health   Financial Resource Strain: Not on file  Food Insecurity: Not on file  Transportation Needs: Not on file  Physical Activity: Not on file  Stress: Not on file  Social Connections: Not on file  Intimate Partner Violence: Not on file    History reviewed. No pertinent surgical history.  Family History  Problem Relation Age of Onset   Kidney failure Mother    Hypertension Mother    Breast cancer Mother    Prostate cancer Father 70   Heart disease Father    Hypertension Sister    Colon polyps Sister     No Known Allergies  Current Outpatient Medications on File Prior to Visit  Medication Sig Dispense Refill   tamsulosin (FLOMAX) 0.4 MG CAPS capsule Take 1 capsule (0.4 mg total) by mouth daily. For urinary stream. 90 capsule 3   No current facility-administered medications on file prior to visit.    BP 120/62   Pulse 68   Temp 98.1 F (36.7 C) (Temporal)   Ht 5\' 7"  (1.702 m)   Wt 224 lb (101.6 kg)   SpO2 97%   BMI 35.08 kg/m  Objective:   Physical Exam HENT:     Right Ear: Tympanic membrane and  ear canal normal.     Left Ear: Tympanic membrane and ear canal normal.     Nose: Nose normal.     Right Sinus: No maxillary sinus tenderness or frontal sinus tenderness.     Left Sinus: No maxillary sinus tenderness or frontal sinus tenderness.  Eyes:     Conjunctiva/sclera: Conjunctivae normal.  Neck:     Thyroid: No thyromegaly.     Vascular: No carotid bruit.  Cardiovascular:     Rate and Rhythm: Normal rate and regular rhythm.     Heart sounds: Normal heart sounds.  Pulmonary:     Effort: Pulmonary effort is normal.     Breath sounds: Normal breath sounds. No wheezing or rales.  Abdominal:     General: Bowel sounds are normal.     Palpations: Abdomen is soft.     Tenderness: There is no  abdominal tenderness.  Musculoskeletal:        General: Normal range of motion.     Cervical back: Neck supple.  Skin:    General: Skin is warm and dry.  Neurological:     Mental Status: He is alert and oriented to person, place, and time.     Cranial Nerves: No cranial nerve deficit.     Deep Tendon Reflexes: Reflexes are normal and symmetric.  Psychiatric:        Mood and Affect: Mood normal.          Assessment & Plan:      This visit occurred during the SARS-CoV-2 public health emergency.  Safety protocols were in place, including screening questions prior to the visit, additional usage of staff PPE, and extensive cleaning of exam room while observing appropriate contact time as indicated for disinfecting solutions.

## 2021-03-21 NOTE — Assessment & Plan Note (Signed)
Shingrix due, provided first dose today. PSA UTD. Colonoscopy due, referral placed.  Discussed the importance of a healthy diet and regular exercise in order for weight loss, and to reduce the risk of further co-morbidity.  Exam today stable. Labs pending and reviewed.

## 2021-03-21 NOTE — Assessment & Plan Note (Signed)
Well controlled in the office today, continue amlodipine 5 mg.

## 2021-03-21 NOTE — Patient Instructions (Addendum)
Stop by the lab prior to leaving today. I will notify you of your results once received.   You will be contacted regarding your referral to GI for the colonoscopy and to Urology.  Please let us know if you have not been contacted within two weeks.   It was a pleasure to see you today!    Health Maintenance Due  Topic Date Due   HIV Screening  Never done  will get with labs    Hepatitis C Screening  Never done  will get with labs    COLONOSCOPY (Pts 45-85yrs Insurance coverage will need to be confirmed)  Never done  referral started    Zoster Vaccines- Shingrix (1 of 2) Never done  will get today    Preventive Care 100-18 Years Old, Male Preventive care refers to lifestyle choices and visits with your health care provider that can promote health and wellness. This includes: A yearly physical exam. This is also called an annual wellness visit. Regular dental and eye exams. Immunizations. Screening for certain conditions. Healthy lifestyle choices, such as: Eating a healthy diet. Getting regular exercise. Not using drugs or products that contain nicotine and tobacco. Limiting alcohol use. What can I expect for my preventive care visit? Physical exam Your health care provider will check your: Height and weight. These may be used to calculate your BMI (body mass index). BMI is a measurement that tells if you are at a healthy weight. Heart rate and blood pressure. Body temperature. Skin for abnormal spots. Counseling Your health care provider may ask you questions about your: Past medical problems. Family's medical history. Alcohol, tobacco, and drug use. Emotional well-being. Home life and relationship well-being. Sexual activity. Diet, exercise, and sleep habits. Work and work Statistician. Access to firearms. What immunizations do I need?  Vaccines are usually given at various ages, according to a schedule. Your health care provider will recommend vaccines for you based on  your age, medicalhistory, and lifestyle or other factors, such as travel or where you work. What tests do I need? Blood tests Lipid and cholesterol levels. These may be checked every 5 years, or more often if you are over 51 years old. Hepatitis C test. Hepatitis B test. Screening Lung cancer screening. You may have this screening every year starting at age 72 if you have a 30-pack-year history of smoking and currently smoke or have quit within the past 15 years. Prostate cancer screening. Recommendations will vary depending on your family history and other risks. Genital exam to check for testicular cancer or hernias. Colorectal cancer screening. All adults should have this screening starting at age 51 and continuing until age 68. Your health care provider may recommend screening at age 51 if you are at increased risk. You will have tests every 1-10 years, depending on your results and the type of screening test. Diabetes screening. This is done by checking your blood sugar (glucose) after you have not eaten for a while (fasting). You may have this done every 1-3 years. STD (sexually transmitted disease) testing, if you are at risk. Follow these instructions at home: Eating and drinking  Eat a diet that includes fresh fruits and vegetables, whole grains, lean protein, and low-fat dairy products. Take vitamin and mineral supplements as recommended by your health care provider. Do not drink alcohol if your health care provider tells you not to drink. If you drink alcohol: Limit how much you have to 0-2 drinks a day. Be aware of how much alcohol  is in your drink. In the [place] one drink equals one 12 oz bottle of beer (355 mL), one 5 oz glass of wine (148 mL), or one 1 oz glass of hard liquor (44 mL).  Lifestyle Take daily care of your teeth and gums. Brush your teeth every morning and night with fluoride toothpaste. Floss one time each day. Stay active. Exercise for at least 30 minutes 5  or more days each week. Do not use any products that contain nicotine or tobacco, such as cigarettes, e-cigarettes, and chewing tobacco. If you need help quitting, ask your health care provider. Do not use drugs. If you are sexually active, practice safe sex. Use a condom or other form of protection to prevent STIs (sexually transmitted infections). If told by your health care provider, take low-dose aspirin daily starting at age 28. Find healthy ways to cope with stress, such as: Meditation, yoga, or listening to music. Journaling. Talking to a trusted person. Spending time with friends and family. Safety Always wear your seat belt while driving or riding in a vehicle. Do not drive: If you have been drinking alcohol. Do not ride with someone who has been drinking. When you are tired or distracted. While texting. Wear a helmet and other protective equipment during sports activities. If you have firearms in your house, make sure you follow all gun safety procedures. What's next? Go to your health care provider once a year for an annual wellness visit. Ask your health care provider how often you should have your eyes and teeth checked. Stay up to date on all vaccines. This information is not intended to replace advice given to you by your health care provider. Make sure you discuss any questions you have with your healthcare provider. Document Revised: 05/30/2019 Document Reviewed: 08/25/2018 Elsevier Patient Education  2022 [employer].

## 2021-03-24 LAB — HEPATITIS C ANTIBODY
Hepatitis C Ab: NONREACTIVE
SIGNAL TO CUT-OFF: 0.2 (ref <1.00)

## 2021-03-24 LAB — HIV ANTIBODY (ROUTINE TESTING W REFLEX): HIV 1&2 Ab, 4th Generation: NONREACTIVE

## 2021-03-25 ENCOUNTER — Other Ambulatory Visit: Payer: Self-pay

## 2021-03-25 MED ORDER — NA SULFATE-K SULFATE-MG SULF 17.5-3.13-1.6 GM/177ML PO SOLN: 354.0000 mL | Freq: Once | ORAL | 0 refills | Status: AC

## 2021-04-04 ENCOUNTER — Other Ambulatory Visit (HOSPITAL_COMMUNITY): Payer: Self-pay

## 2021-04-06 NOTE — Progress Notes (Deleted)
   04/07/2021 12:13 PM   Keith Mills [birth year] [id number]  Referring provider: Pleas Koch, NP [place],  [place] [address]  No chief complaint on file.   HPI: Keith Mills is a 50 y.o. male referred for weak urinary stream and family history prostate cancer.  Saw PCP February 2022 with complaints of a weak urinary stream Started on tamsulosin with improvement in symptoms PSA 10/22/2020 was 1.75 Family history prostate cancer in father and requested urology referral No dysuria or gross hematuria Denies flank, abdominal or pelvic pain     PMH: No past medical history on file.  Surgical History: No past surgical history on file.  Home Medications:  Allergies as of 04/07/2021   No Known Allergies      Medication List        Accurate as of April 06, 2021 12:13 PM. If you have any questions, ask your nurse or doctor.          amLODipine 5 MG tablet Commonly known as: NORVASC Take 1 tablet (5 mg total) by mouth daily. For blood pressure.   tamsulosin 0.4 MG Caps capsule Commonly known as: FLOMAX Take 1 capsule (0.4 mg total) by mouth daily. For urinary stream.        Allergies: No Known Allergies  Family History: Family History  Problem Relation Age of Onset   Kidney failure Mother    Hypertension Mother    Breast cancer Mother    Prostate cancer Father 40   Heart disease Father    Hypertension Sister    Colon polyps Sister     Social History:  reports that he has never smoked. He has never used smokeless tobacco. He reports current alcohol use. No history on file for drug use.   Physical Exam: There were no vitals taken for this visit.  Constitutional:  Alert and oriented, No acute distress. HEENT: [place] AT, moist mucus membranes.  Trachea midline, no masses. Cardiovascular: No clubbing, cyanosis, or edema. Respiratory: Normal respiratory effort, no increased work of breathing. GI: Abdomen is soft, nontender, nondistended, no  abdominal masses GU: No CVA tenderness Lymph: No cervical or inguinal lymphadenopathy. Skin: No rashes, bruises or suspicious lesions. Neurologic: Grossly intact, no focal deficits, moving all 4 extremities. Psychiatric: Normal mood and affect.  Laboratory Data:  Lab Results  Component Value Date   PSA 1.75 10/22/2020   PSA 0.74 03/08/2020   PSA 0.95 02/28/2018    Urinalysis    Component Value Date/Time   BILIRUBINUR neg 10/22/2020 1057   PROTEINUR Positive (A) 10/22/2020 1057   UROBILINOGEN 0.2 10/22/2020 1057   NITRITE neg 10/22/2020 1057   LEUKOCYTESUR Negative 10/22/2020 1057     Assessment & Plan:    1.  Lower urinary tract symptoms Most likely secondary to BPH Symptoms improved on tamsulosin  2.  Family history prostate cancer Most recent PSA elevated above baseline Benign DRE  Keith Sons, MD  [facility] [address] [place], [place] [address] [phone]

## 2021-04-07 ENCOUNTER — Other Ambulatory Visit: Payer: Self-pay

## 2021-04-07 ENCOUNTER — Ambulatory Visit: Payer: Self-pay | Admitting: Urology

## 2021-04-07 ENCOUNTER — Telehealth: Payer: Self-pay | Admitting: Gastroenterology

## 2021-04-07 DIAGNOSIS — Z8042 Family history of malignant neoplasm of prostate: Secondary | ICD-10-CM

## 2021-04-07 DIAGNOSIS — N401 Enlarged prostate with lower urinary tract symptoms: Secondary | ICD-10-CM

## 2021-04-07 MED ORDER — CLENPIQ 10-3.5-12 MG-GM -GM/160ML PO SOLN
320.0000 mL | ORAL | 0 refills | Status: DC
  Filled 2021-04-07: qty 320, 1d supply, fill #0

## 2021-04-07 MED ORDER — CLENPIQ 10-3.5-12 MG-GM -GM/160ML PO SOLN: 320.0000 mL | ORAL | 0 refills | Status: DC

## 2021-04-07 NOTE — Telephone Encounter (Signed)
Patient stated that his prepping medicine hasn't been called into his pharmacy and would like a returned call from [facility]. Clinical staff will follow up with patient.

## 2021-04-07 NOTE — Telephone Encounter (Signed)
Returned called and spoke with pt's wife. Advised bowel prep has been sent to pharmacy.

## 2021-04-08 ENCOUNTER — Encounter: Payer: Self-pay | Admitting: Gastroenterology

## 2021-04-08 ENCOUNTER — Encounter: Payer: Self-pay | Admitting: Urology

## 2021-04-08 ENCOUNTER — Ambulatory Visit: Payer: 59 | Admitting: Certified Registered"

## 2021-04-08 ENCOUNTER — Ambulatory Visit
Admission: RE | Admit: 2021-04-08 | Discharge: 2021-04-08 | Disposition: A | Discharge status: Home / Self Care | Payer: 59 | Attending: Gastroenterology | Admitting: Gastroenterology

## 2021-04-08 ENCOUNTER — Encounter
Admission: RE | Disposition: A | Discharge status: Home / Self Care | Payer: Self-pay | Source: Home / Self Care | Attending: Gastroenterology

## 2021-04-08 DIAGNOSIS — Z6832 Body mass index (BMI) 32.0-32.9, adult: Secondary | ICD-10-CM | POA: Diagnosis not present

## 2021-04-08 DIAGNOSIS — Z1211 Encounter for screening for malignant neoplasm of colon: Secondary | ICD-10-CM | POA: Insufficient documentation

## 2021-04-08 DIAGNOSIS — E669 Obesity, unspecified: Secondary | ICD-10-CM | POA: Diagnosis not present

## 2021-04-08 DIAGNOSIS — D124 Benign neoplasm of descending colon: Secondary | ICD-10-CM | POA: Diagnosis not present

## 2021-04-08 DIAGNOSIS — K635 Polyp of colon: Secondary | ICD-10-CM | POA: Diagnosis not present

## 2021-04-08 DIAGNOSIS — D374 Neoplasm of uncertain behavior of colon: Secondary | ICD-10-CM | POA: Diagnosis not present

## 2021-04-08 DIAGNOSIS — I10 Essential (primary) hypertension: Secondary | ICD-10-CM | POA: Diagnosis not present

## 2021-04-08 DIAGNOSIS — D126 Benign neoplasm of colon, unspecified: Secondary | ICD-10-CM | POA: Diagnosis not present

## 2021-04-08 HISTORY — PX: COLONOSCOPY: SHX5424

## 2021-04-08 HISTORY — DX: Essential (primary) hypertension: I10

## 2021-04-08 SURGERY — COLONOSCOPY
Anesthesia: General

## 2021-04-08 MED ORDER — MIDAZOLAM HCL 2 MG/2ML IJ SOLN
INTRAMUSCULAR | Status: DC | PRN
  Administered 2021-04-08: 2 mg via INTRAVENOUS

## 2021-04-08 MED ORDER — SODIUM CHLORIDE 0.9 % IV SOLN: INTRAVENOUS | Status: DC

## 2021-04-08 MED ORDER — PROPOFOL 500 MG/50ML IV EMUL
INTRAVENOUS | Status: DC | PRN
  Administered 2021-04-08: 165 ug/kg/min via INTRAVENOUS

## 2021-04-08 MED ORDER — LIDOCAINE HCL (CARDIAC) PF 100 MG/5ML IV SOSY
PREFILLED_SYRINGE | INTRAVENOUS | Status: DC | PRN
  Administered 2021-04-08: 100 mg via INTRAVENOUS

## 2021-04-08 MED ORDER — PROPOFOL 10 MG/ML IV BOLUS
INTRAVENOUS | Status: DC | PRN
  Administered 2021-04-08 (×2): 20 mg via INTRAVENOUS
  Administered 2021-04-08: 60 mg via INTRAVENOUS

## 2021-04-08 MED ORDER — MIDAZOLAM HCL 2 MG/2ML IJ SOLN
INTRAMUSCULAR | Status: AC
  Filled 2021-04-08: qty 2

## 2021-04-08 NOTE — Transfer of Care (Signed)
Immediate Anesthesia Transfer of Care Note  Patient: Keith Mills  Procedure(s) Performed: COLONOSCOPY  Patient Location: Endoscopy Unit  Anesthesia Type:General  Level of Consciousness: drowsy and patient cooperative  Airway & Oxygen Therapy: Patient Spontanous Breathing and Patient connected to face mask oxygen  Post-op Assessment: Report given to RN and Post -op Vital signs reviewed and stable  Post vital signs: Reviewed and stable  Last Vitals:  Vitals Value Taken Time  BP 100/63 04/08/21 1130  Temp    Pulse 72 04/08/21 1131  Resp 13 04/08/21 1131  SpO2 100 % 04/08/21 1131  Vitals shown include unvalidated device data.  Last Pain:  Vitals:   04/08/21 0938  TempSrc: Temporal  PainSc: 0-No pain         Complications: No notable events documented.

## 2021-04-08 NOTE — Anesthesia Postprocedure Evaluation (Signed)
Anesthesia Post Note  Patient: DELAN CANJURA  Procedure(s) Performed: COLONOSCOPY  Patient location during evaluation: Endoscopy Anesthesia Type: General Level of consciousness: awake and alert Pain management: pain level controlled Vital Signs Assessment: post-procedure vital signs reviewed and stable Respiratory status: spontaneous breathing, nonlabored ventilation, respiratory function stable and patient connected to nasal cannula oxygen Cardiovascular status: blood pressure returned to baseline and stable Postop Assessment: no apparent nausea or vomiting Anesthetic complications: no   No notable events documented.   Last Vitals:  Vitals:   04/08/21 1140 04/08/21 1202  BP: 110/74 121/85  Pulse: 64 60  Resp: 13 16  Temp:    SpO2: 100% 97%    Last Pain:  Vitals:   04/08/21 1202  TempSrc:   PainSc: 0-No pain                 Precious Haws Zailyn Thoennes

## 2021-04-08 NOTE — Op Note (Signed)
[facility] Gastroenterology Patient Name: Keith Mills Procedure Date: 04/08/2021 11:01 AM MRN: [id number] Account #: [id number] Date of Birth: 1970/11/22 Admit Type: Outpatient Age: 50 Room: [facility] ENDO ROOM 2 Gender: Male Note Status: Finalized Procedure:             Colonoscopy Indications:           Screening for colorectal malignant neoplasm Providers:             Jonathon Bellows MD, MD Referring MD:          No Local Md, MD (Referring MD) Medicines:             Monitored Anesthesia Care Complications:         No immediate complications. Procedure:             Pre-Anesthesia Assessment:                        - Prior to the procedure, a History and Physical was                         performed, and patient medications, allergies and                         sensitivities were reviewed. The patient's tolerance                         of previous anesthesia was reviewed.                        - The risks and benefits of the procedure and the                         sedation options and risks were discussed with the                         patient. All questions were answered and informed                         consent was obtained.                        - ASA Grade Assessment: II - A patient with mild                         systemic disease.                        After obtaining informed consent, the colonoscope was                         passed under direct vision. Throughout the procedure,                         the patient's blood pressure, pulse, and oxygen                         saturations were monitored continuously. The                         Colonoscope was introduced through the anus  and                         advanced to the the cecum, identified by the                         appendiceal orifice. The colonoscopy was performed                         with ease. The patient tolerated the procedure well.                         The quality of the  bowel preparation was good. The                         quality of the bowel preparation was good. Findings:      The perianal and digital rectal examinations were normal.      A 5 mm polyp was found in the descending colon. The polyp was sessile.       The polyp was removed with a cold snare. Resection and retrieval were       complete.      A 12 mm polyp was found in the descending colon. The polyp was       semi-pedunculated. The polyp was removed with a cold snare. Resection       and retrieval were complete. To prevent bleeding post-intervention, one       hemostatic clip was successfully placed. There was no bleeding during,       or at the end, of the procedure.      A 20 mm polyp was found in the sigmoid colon. The polyp was       pedunculated. The polyp was removed with a hot snare. Resection and       retrieval were complete. To prevent bleeding post-intervention, one       hemostatic clip was successfully placed. There was no bleeding during,       or at the end, of the procedure.      The exam was otherwise without abnormality on direct and retroflexion       views. Impression:            - One 5 mm polyp in the descending colon, removed with                         a cold snare. Resected and retrieved.                        - One 12 mm polyp in the descending colon, removed                         with a cold snare. Resected and retrieved. Clip was                         placed.                        - One 20 mm polyp in the descending colon, removed                         with a hot  snare. Resected and retrieved. Clip was                         placed.                        - The examination was otherwise normal on direct and                         retroflexion views. Recommendation:        - Discharge patient to home (with escort).                        - Resume previous diet.                        - Continue present medications.                        - Await  pathology results.                        - Repeat colonoscopy for surveillance based on                         pathology results. Procedure Code(s):     --- Professional ---                        [phone], Colonoscopy, flexible; with removal of                         tumor(s), polyp(s), or other lesion(s) by snare                         technique Diagnosis Code(s):     --- Professional ---                        Z12.11, Encounter for screening for malignant neoplasm                         of colon                        K63.5, Polyp of colon CPT copyright 2019 American Medical Association. All rights reserved. The codes documented in this report are preliminary and upon coder review may  be revised to meet current compliance requirements. Jonathon Bellows, MD Jonathon Bellows MD, MD 04/08/2021 11:30:06 AM This report has been signed electronically. Number of Addenda: 0 Note Initiated On: 04/08/2021 11:01 AM Scope Withdrawal Time: 0 hours 14 minutes 41 seconds  Total Procedure Duration: 0 hours 16 minutes 43 seconds  Estimated Blood Loss:  Estimated blood loss: none.      [facility]

## 2021-04-08 NOTE — Anesthesia Preprocedure Evaluation (Signed)
Anesthesia Evaluation  Patient identified by MRN, date of birth, ID band Patient awake    Reviewed: Allergy & Precautions, NPO status , Patient's Chart, lab work & pertinent test results  History of Anesthesia Complications Negative for: history of anesthetic complications  Airway Mallampati: II  TM Distance: >3 FB Neck ROM: Full    Dental no notable dental hx.    Pulmonary neg pulmonary ROS, neg sleep apnea, neg COPD,    breath sounds clear to auscultation- rhonchi (-) wheezing      Cardiovascular Exercise Tolerance: Good hypertension, Pt. on medications (-) CAD, (-) Past MI, (-) Cardiac Stents and (-) CABG  Rhythm:Regular Rate:Normal - Systolic murmurs and - Diastolic murmurs    Neuro/Psych neg Seizures negative neurological ROS  negative psych ROS   GI/Hepatic negative GI ROS, Neg liver ROS,   Endo/Other  negative endocrine ROSneg diabetes  Renal/GU negative Renal ROS     Musculoskeletal negative musculoskeletal ROS (+)   Abdominal (+) + obese,   Peds  Hematology negative hematology ROS (+)   Anesthesia Other Findings Past Medical History: No date: Hypertension   Reproductive/Obstetrics                             Anesthesia Physical Anesthesia Plan  ASA: 2  Anesthesia Plan: General   Post-op Pain Management:    Induction: Intravenous  PONV Risk Score and Plan: 1 and Propofol infusion  Airway Management Planned: Natural Airway  Additional Equipment:   Intra-op Plan:   Post-operative Plan:   Informed Consent: I have reviewed the patients History and Physical, chart, labs and discussed the procedure including the risks, benefits and alternatives for the proposed anesthesia with the patient or authorized representative who has indicated his/her understanding and acceptance.     Dental advisory given  Plan Discussed with: CRNA and Anesthesiologist  Anesthesia Plan  Comments:         Anesthesia Quick Evaluation

## 2021-04-08 NOTE — H&P (Signed)
Jonathon Bellows, MD [address], [place], [place], [place], [address] [address], [place], [place], [place], [address] Phone: [phone]  Fax: [phone]  Primary Care Physician:  Pleas Koch, NP   Pre-Procedure History & Physical: HPI:  Keith Mills is a 50 y.o. male is here for an colonoscopy.   Past Medical History:  Diagnosis Date   Hypertension     History reviewed. No pertinent surgical history.  Prior to Admission medications   Medication Sig Start Date End Date Taking? Authorizing Provider  amLODipine (NORVASC) 5 MG tablet Take 1 tablet (5 mg total) by mouth daily. For blood pressure. 03/21/21  Yes Pleas Koch, NP  tamsulosin (FLOMAX) 0.4 MG CAPS capsule Take 1 capsule (0.4 mg total) by mouth daily. For urinary stream. 11/13/20  Yes Pleas Koch, NP  Sod Picosulfate-Mag Ox-Cit Acd (CLENPIQ) 10-3.5-12 MG-GM -GM/160ML SOLN Take 320 mLs by mouth as directed. 04/07/21   Jonathon Bellows, MD    Allergies as of 03/25/2021   (No Known Allergies)    Family History  Problem Relation Age of Onset   Kidney failure Mother    Hypertension Mother    Breast cancer Mother    Prostate cancer Father 67   Heart disease Father    Hypertension Sister    Colon polyps Sister     Social History   Socioeconomic History   Marital status: Married    Spouse name: Not on file   Number of children: Not on file   Years of education: Not on file   Highest education level: Not on file  Occupational History   Not on file  Tobacco Use   Smoking status: Never   Smokeless tobacco: Never  Vaping Use   Vaping Use: Never used  Substance and Sexual Activity   Alcohol use: Yes    Comment: moderate   Drug use: Never   Sexual activity: Not on file  Other Topics Concern   Not on file  Social History Narrative   Married.   2 children.   Works as a Armed forces operational officer.   Enjoys playing sports with his children, exercising.    Social Determinants of Health    Financial Resource Strain: Not on file  Food Insecurity: Not on file  Transportation Needs: Not on file  Physical Activity: Not on file  Stress: Not on file  Social Connections: Not on file  Intimate Partner Violence: Not on file    Review of Systems: See HPI, otherwise negative ROS  Physical Exam: BP (!) 139/109   Pulse 69   Temp (!) 96.6 F (35.9 C) (Temporal)   Resp 18   Ht '5\' 8"'$  (1.727 m)   Wt 98.4 kg   SpO2 100%   BMI 32.99 kg/m  General:   Alert,  pleasant and cooperative in NAD Head:  Normocephalic and atraumatic. Neck:  Supple; no masses or thyromegaly. Lungs:  Clear throughout to auscultation, normal respiratory effort.    Heart:  +S1, +S2, Regular rate and rhythm, No edema. Abdomen:  Soft, nontender and nondistended. Normal bowel sounds, without guarding, and without rebound.   Neurologic:  Alert and  oriented x4;  grossly normal neurologically.  Impression/Plan: Keith Mills is here for an colonoscopy to be performed for Screening colonoscopy with family history of colon polyps Risks, benefits, limitations, and alternatives regarding  colonoscopy have been reviewed with the patient.  Questions have been answered.  All parties agreeable.   Jonathon Bellows, MD  04/08/2021, 10:57 AM

## 2021-04-09 ENCOUNTER — Encounter: Payer: Self-pay | Admitting: Gastroenterology

## 2021-04-09 LAB — SURGICAL PATHOLOGY

## 2021-04-21 ENCOUNTER — Telehealth: Payer: Self-pay

## 2021-04-21 NOTE — Telephone Encounter (Signed)
-----   Message from Jonathon Bellows, MD sent at 04/21/2021  9:36 AM EDT ----- Needs video or office visit to discuss results please . Happy to see him or video him on 8/16 between 2-3 pm or 8/18 same time or any other date

## 2021-04-21 NOTE — Telephone Encounter (Signed)
Called patient to let him know that Dr. Vicente Males wanted to do a MyChart visit with him to go over his lab results. Patient agreed to have it on 04/29/2021 at 2:30 PM. Patient had no further questions.

## 2021-04-29 ENCOUNTER — Telehealth (INDEPENDENT_AMBULATORY_CARE_PROVIDER_SITE_OTHER): Payer: Self-pay | Admitting: Gastroenterology

## 2021-04-29 ENCOUNTER — Telehealth: Payer: Self-pay

## 2021-04-29 ENCOUNTER — Other Ambulatory Visit (HOSPITAL_COMMUNITY): Payer: Self-pay

## 2021-04-29 DIAGNOSIS — Z5329 Procedure and treatment not carried out because of patient's decision for other reasons: Secondary | ICD-10-CM

## 2021-04-29 NOTE — Telephone Encounter (Signed)
Called patient to go over his chart before he was able to do his MyChart Video visit and at the end after trying to explain what to do, he wasn't able to. Therefore, the patient requested for Dr. Vicente Males to give him a call. But I had to let him know that the physicians had to see him either in person or via [facility]. So I suggested for him to schedule an appointment in person. Patient agreed and he will be seen on 05/20/2021 at 2:30 PM.

## 2021-04-29 NOTE — Progress Notes (Signed)
No show

## 2021-05-02 ENCOUNTER — Other Ambulatory Visit (HOSPITAL_COMMUNITY): Payer: Self-pay

## 2021-05-20 ENCOUNTER — Encounter: Payer: Self-pay | Admitting: Gastroenterology

## 2021-05-20 ENCOUNTER — Other Ambulatory Visit: Payer: Self-pay

## 2021-05-20 ENCOUNTER — Ambulatory Visit: Payer: 59 | Admitting: Gastroenterology

## 2021-05-20 VITALS — BP 150/91 | HR 71 | Temp 98.2°F | Ht 67.0 in | Wt 227.8 lb

## 2021-05-20 DIAGNOSIS — D126 Benign neoplasm of colon, unspecified: Secondary | ICD-10-CM

## 2021-05-20 NOTE — Patient Instructions (Addendum)
We will refer you to General Surgery for a consult. They will call you to set up your appointment date and time.  [facility] [phone]  Dr. Caroleen Hamman

## 2021-05-20 NOTE — Progress Notes (Signed)
Jonathon Bellows MD, MRCP(U.K) [address]  [place]  [place], [place] [address]  Main: [phone]  Fax: [phone]   Gastroenterology Consultation  Referring Provider:     Pleas Koch, NP Primary Care Physician:  Pleas Koch, NP Primary Gastroenterologist:  Dr. Jonathon Bellows  Reason for Consultation:     Results of recent colonoscopy        HPI:   Keith Mills is a 50 y.o. y/o male referred for consultation & management  by  Pleas Koch, NP.     He is here today to discuss results of his colonoscopy that he had on 04/08/2021. Colonoscopy was performed for colon cancer screening.  5 mm polyp was resected in the descending colon with a cold snare, 12 mm polyp was also resected in the descending colon that was to be pedunculated with a cold snare and a clip was placed subsequently.  A 20 mm polyp was resected in the sigmoid colon that was pedunculated and a hemostatic clip was placed at resection.  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    I did discuss the results with the pathologist who could not confirm if the entire polyp in the descending colon was resected completely as the polyp was noted to be in pieces during the examination.    Past Medical History:  Diagnosis Date   Hypertension     Past Surgical History:  Procedure Laterality Date   COLONOSCOPY N/A 04/08/2021   Procedure: COLONOSCOPY;  Surgeon: Jonathon Bellows, MD;  Location: [facility] ENDOSCOPY;  Service: Gastroenterology;  Laterality: N/A;    Prior to Admission medications   Medication Sig Start Date End Date Taking? Authorizing Provider  amLODipine (NORVASC) 5 MG tablet Take 1 tablet (5 mg total) by mouth daily. For blood pressure. 03/21/21   Pleas Koch, NP  Sod Picosulfate-Mag Ox-Cit Acd  (CLENPIQ) 10-3.5-12 MG-GM -GM/160ML SOLN Take 320 mLs by mouth as directed. 04/07/21   Jonathon Bellows, MD  tamsulosin (FLOMAX) 0.4 MG CAPS capsule Take 1 capsule (0.4 mg total) by mouth daily. For urinary stream. 11/13/20   Pleas Koch, NP    Family History  Problem Relation Age of Onset   Kidney failure Mother    Hypertension Mother    Breast cancer Mother    Prostate cancer Father 41   Heart disease Father    Hypertension Sister    Colon polyps Sister      Social History   Tobacco Use   Smoking status: Never   Smokeless tobacco: Never  Vaping Use   Vaping Use: Never used  Substance Use Topics   Alcohol use: Yes    Comment: moderate   Drug use: Never    Allergies as of 05/20/2021   (No Known Allergies)    Review of Systems:    All systems reviewed and negative except where noted in HPI.   Physical Exam:  There were no vitals taken for this visit. No LMP for male patient. Psych:  Alert and cooperative. Normal mood and affect. General:   Alert,  Well-developed, well-nourished, pleasant and cooperative in NAD Head:  Normocephalic and atraumatic. Neurologic:  Alert and oriented x3;  grossly normal neurologically. Psych:  Alert and cooperative. Normal mood and affect.  Imaging Studies: No results found.  Assessment and Plan:  Keith Mills is a 50 y.o. y/o male here today to discuss results of his recent colonoscopy in July 2022.  I did intend to see him sooner than today but he was unable to make it for the appointment hence the delayed visit to discuss the results.  He had 3 polyps resected in his colon a small polyp in the descending colon which was a tubular adenoma a larger 7 pedunculated polyp which was a tubulovillous adenoma with focal high-grade dysplasia and a large polyp in the sigmoid colon which was a villous adenoma.  Unfortunately the pathologist was not able to say if the entire polyp was resected in the descending colon that had the high-grade  dysplasia since the polyp on retrieval was in multiple pieces.  I have discussed with the patient that it is possible very likely that I had resected the entire polyp but there is no definite way to say so.  A more definitive option would be to get surgery on the left side of the colon thereby the polypectomy site would also be resected and we will be sure that there is no residual polyp left behind.  We could go back with a colonoscopy and have a look at the left side of the colon which could also be an option but it is very likely that we will not be able to identify the polypectomy site.  I will refer him to Dr. Dahlia Byes to have a discussion about the same.  Repeat colonoscopy in 1 year  Follow up in as needed  Dr Jonathon Bellows MD,MRCP(U.K)

## 2021-05-23 ENCOUNTER — Other Ambulatory Visit (HOSPITAL_COMMUNITY): Payer: Self-pay

## 2021-05-26 ENCOUNTER — Ambulatory Visit: Payer: 59 | Admitting: Surgery

## 2021-05-29 ENCOUNTER — Other Ambulatory Visit (HOSPITAL_COMMUNITY): Payer: Self-pay

## 2021-05-30 ENCOUNTER — Other Ambulatory Visit (HOSPITAL_COMMUNITY): Payer: Self-pay

## 2021-06-04 ENCOUNTER — Other Ambulatory Visit: Payer: Self-pay | Admitting: Primary Care

## 2021-06-04 ENCOUNTER — Other Ambulatory Visit: Payer: Self-pay

## 2021-06-04 DIAGNOSIS — I10 Essential (primary) hypertension: Secondary | ICD-10-CM

## 2021-06-04 DIAGNOSIS — R3912 Poor urinary stream: Secondary | ICD-10-CM

## 2021-06-04 MED FILL — Amlodipine Besylate Tab 5 MG (Base Equivalent): ORAL | 90 days supply | Qty: 90 | Fill #0 | Status: AC

## 2021-06-04 MED FILL — Tamsulosin HCl Cap 0.4 MG: ORAL | 90 days supply | Qty: 90 | Fill #0 | Status: AC

## 2021-06-05 ENCOUNTER — Other Ambulatory Visit: Payer: Self-pay

## 2021-06-09 ENCOUNTER — Encounter: Payer: Self-pay | Admitting: Surgery

## 2021-06-09 ENCOUNTER — Other Ambulatory Visit: Payer: Self-pay

## 2021-06-09 ENCOUNTER — Ambulatory Visit (INDEPENDENT_AMBULATORY_CARE_PROVIDER_SITE_OTHER): Payer: 59 | Admitting: Surgery

## 2021-06-09 VITALS — BP 165/99 | HR 84 | Temp 98.1°F | Ht 68.0 in | Wt 227.6 lb

## 2021-06-09 DIAGNOSIS — K6389 Other specified diseases of intestine: Secondary | ICD-10-CM | POA: Diagnosis not present

## 2021-06-09 MED ORDER — METRONIDAZOLE 500 MG PO TABS
ORAL_TABLET | ORAL | 0 refills | Status: DC
  Filled 2021-06-09 – 2021-07-14 (×2): qty 6, 1d supply, fill #0

## 2021-06-09 MED ORDER — DULCOLAX 5 MG PO TBEC
5.0000 mg | DELAYED_RELEASE_TABLET | Freq: Once | ORAL | 0 refills | Status: AC
  Filled 2021-06-09: qty 4, 4d supply, fill #0

## 2021-06-09 MED ORDER — NEOMYCIN SULFATE 500 MG PO TABS
1000.0000 mg | ORAL_TABLET | Freq: Three times a day (TID) | ORAL | 0 refills | Status: DC
  Filled 2021-06-09 – 2021-07-14 (×2): qty 6, 1d supply, fill #0

## 2021-06-09 MED ORDER — POLYETHYLENE GLYCOL 3350 17 GM/SCOOP PO POWD
1.0000 | Freq: Once | ORAL | 0 refills | Status: AC
  Filled 2021-06-09: qty 255, 1d supply, fill #0

## 2021-06-09 NOTE — Patient Instructions (Addendum)
CT Abdomen and Pelvis scheduled 06/11/21 @ 9:45 at the [facility] entrance. Please pick up your prep kit today at [facility].  [place] [address]  Please have labs done 30 minutes prior to having the CT scan.   Please pick up your medications at the pharmacy. You will need to buy a 64 oz bottle of Gatorade.   Please follow the bowel prep instructions. If you have questions call our office.    Our surgery scheduler will call you within 24-48 hours to schedule your surgery. Please have the [facility] surgery sheet available when speaking with her.

## 2021-06-10 ENCOUNTER — Other Ambulatory Visit: Payer: Self-pay

## 2021-06-11 ENCOUNTER — Ambulatory Visit
Admission: RE | Admit: 2021-06-11 | Discharge: 2021-06-11 | Disposition: A | Discharge status: Home / Self Care | Payer: 59 | Source: Ambulatory Visit | Attending: Surgery | Admitting: Surgery

## 2021-06-11 ENCOUNTER — Telehealth: Payer: Self-pay | Admitting: Surgery

## 2021-06-11 ENCOUNTER — Other Ambulatory Visit: Payer: Self-pay

## 2021-06-11 ENCOUNTER — Other Ambulatory Visit
Admission: RE | Admit: 2021-06-11 | Discharge: 2021-06-11 | Disposition: A | Discharge status: Home / Self Care | Payer: 59 | Source: Home / Self Care | Attending: Surgery | Admitting: Surgery

## 2021-06-11 DIAGNOSIS — K4020 Bilateral inguinal hernia, without obstruction or gangrene, not specified as recurrent: Secondary | ICD-10-CM | POA: Diagnosis not present

## 2021-06-11 DIAGNOSIS — K6389 Other specified diseases of intestine: Secondary | ICD-10-CM | POA: Insufficient documentation

## 2021-06-11 LAB — COMPREHENSIVE METABOLIC PANEL
ALT: 25 U/L (ref 0–44)
AST: 30 U/L (ref 15–41)
Albumin: 4.3 g/dL (ref 3.5–5.0)
Alkaline Phosphatase: 78 U/L (ref 38–126)
Anion gap: 11 (ref 5–15)
BUN: 17 mg/dL (ref 6–20)
CO2: 25 mmol/L (ref 22–32)
Calcium: 9.3 mg/dL (ref 8.9–10.3)
Chloride: 102 mmol/L (ref 98–111)
Creatinine, Ser: 1.55 mg/dL — ABNORMAL HIGH (ref 0.61–1.24)
GFR, Estimated: 54 mL/min — ABNORMAL LOW (ref >60)
Glucose, Bld: 101 mg/dL — ABNORMAL HIGH (ref 70–99)
Potassium: 3.9 mmol/L (ref 3.5–5.1)
Sodium: 138 mmol/L (ref 135–145)
Total Bilirubin: 0.8 mg/dL (ref 0.3–1.2)
Total Protein: 7.6 g/dL (ref 6.5–8.1)

## 2021-06-11 LAB — CBC WITH DIFFERENTIAL/PLATELET
Abs Immature Granulocytes: 0.02 10*3/uL (ref 0.00–0.07)
Basophils Absolute: 0 10*3/uL (ref 0.0–0.1)
Basophils Relative: 0 %
Eosinophils Absolute: 0.1 10*3/uL (ref 0.0–0.5)
Eosinophils Relative: 3 %
HCT: 41.3 % (ref 39.0–52.0)
Hemoglobin: 14.3 g/dL (ref 13.0–17.0)
Immature Granulocytes: 0 %
Lymphocytes Relative: 32 %
Lymphs Abs: 1.5 10*3/uL (ref 0.7–4.0)
MCH: 31 pg (ref 26.0–34.0)
MCHC: 34.6 g/dL (ref 30.0–36.0)
MCV: 89.4 fL (ref 80.0–100.0)
Monocytes Absolute: 0.4 10*3/uL (ref 0.1–1.0)
Monocytes Relative: 10 %
Neutro Abs: 2.5 10*3/uL (ref 1.7–7.7)
Neutrophils Relative %: 55 %
Platelets: 223 10*3/uL (ref 150–400)
RBC: 4.62 MIL/uL (ref 4.22–5.81)
RDW: 13.2 % (ref 11.5–15.5)
WBC: 4.5 10*3/uL (ref 4.0–10.5)
nRBC: 0 % (ref 0.0–0.2)

## 2021-06-11 LAB — POCT I-STAT CREATININE: Creatinine, Ser: 1.5 mg/dL — ABNORMAL HIGH (ref 0.61–1.24)

## 2021-06-11 MED ORDER — IOHEXOL 350 MG/ML SOLN
100.0000 mL | Freq: Once | INTRAVENOUS | Status: AC | PRN
  Administered 2021-06-11: 100 mL via INTRAVENOUS

## 2021-06-11 NOTE — Telephone Encounter (Signed)
Outgoing call is made, left message for patient to call.  Please advise patient of Pre-Admission date/time, COVID Testing date and Surgery date.  Surgery Date: 07/03/21 Preadmission Testing Date: 06/26/21 (phone 8a-1p) Covid Testing Date: 07/01/21 @ 8:20 am - patient advised to go to the [facility] ([place])   Also patient will need to call at [phone], between 1-3:00pm the day before surgery, to find out what time to arrive for surgery.

## 2021-06-12 NOTE — Telephone Encounter (Signed)
LVM for pt to call the office for CT results.

## 2021-06-13 ENCOUNTER — Other Ambulatory Visit: Payer: Self-pay

## 2021-06-16 NOTE — Progress Notes (Signed)
Surgical Consultation  06/16/2021  Keith Mills is an 50 y.o. male.   Chief Complaint  Patient presents with   New Patient (Initial Visit)    Colon mass     HPI: Seen in consultation at the request of Dr. Vicente Males.  He underwent anoscopy where 3 polyps were removed.  There was evidence of a large 29mm polyp in the descending colon that was removed by hot snare. 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  Please note that I have personally reviewed endoscopic images. His father did have prostate cancer. Is able to perform more than 4 METS of activity without any shortness of breath or chest pain.  Comes accompanied with his wife.  CMP was completely normal as well as a hemoglobin A1c.  CBC shows mild leukopenia of 3.6 but the rest is normal.  Unknown clinical significance.  Past Medical History:  Diagnosis Date   Hypertension     Past Surgical History:  Procedure Laterality Date   COLONOSCOPY N/A 04/08/2021   Procedure: COLONOSCOPY;  Surgeon: Jonathon Bellows, MD;  Location: [facility] ENDOSCOPY;  Service: Gastroenterology;  Laterality: N/A;    Family History  Problem Relation Age of Onset   Kidney failure Mother    Hypertension Mother    Breast cancer Mother    Prostate cancer Father 68   Heart disease Father    Hypertension Sister    Colon polyps Sister     Social History:  reports that he has never smoked. He has never used smokeless tobacco. He reports current alcohol use. He reports that he does not use drugs.  Allergies: No Known Allergies  Medications reviewed.     ROS Full ROS performed and is otherwise negative other than what is stated in the HPI    BP (!) 165/99   Pulse 84   Temp 98.1 F (36.7 C) (Oral)   Ht 5\' 8"  (1.727 m)   Wt 227 lb 9.6 oz (103.2 kg)   SpO2 99%   BMI 34.61 kg/m   Physical Exam Vitals and nursing note reviewed. Exam conducted  with a chaperone present.  Constitutional:      General: He is not in acute distress.    Appearance: Normal appearance. He is not ill-appearing or toxic-appearing.  Eyes:     General:        Right eye: No discharge.        Left eye: No discharge.  Cardiovascular:     Rate and Rhythm: Normal rate and regular rhythm.     Heart sounds: No murmur heard.   No friction rub.  Pulmonary:     Effort: Pulmonary effort is normal. No respiratory distress.     Breath sounds: Normal breath sounds. No stridor. No wheezing.  Abdominal:     General: Abdomen is flat. There is no distension.     Palpations: Abdomen is soft. There is no mass.     Tenderness: There is no abdominal tenderness. There is no guarding.     Hernia: No hernia is present.  Musculoskeletal:        General: Normal range of motion.     Cervical back: Normal range of motion and neck supple. No rigidity or tenderness.  Lymphadenopathy:     Cervical: No cervical adenopathy.  Skin:    General: Skin is warm and dry.     Capillary Refill: Capillary refill takes less than 2 seconds.  Neurological:     General: No focal deficit present.     Mental Status: He is alert and oriented to person, place, and time.  Psychiatric:        Mood and Affect: Mood normal.        Behavior: Behavior normal.        Thought Content: Thought content normal.        Judgment: Judgment normal.    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  Removed in a piece meal fashion and unable to determine resection margins.  He had an extensive discussion with the patient and her wife regarding disease process.  Options of left colectomy versus close up colonoscopy were discussed at length. Currently he does not wish to take any chances and is very concerned of any residual cancer and wishes to undergo left colectomy.  I do think that he will be a very good candidate for robotic  approach.  Procedure discussed with the patient in detail.  Risks, benefits and possible applications including but not limited to: Bleeding, infection, injury to adjacent organs include bowel and ureter.  We also discussed the distant possibility of potential colostomy.  He understands and wishes to proceed. We will also start a work-up for potential metastatic disease with a CT scan of the abdomen pelvis and repeat some labs to include a CMP and CBC.  I will see him back 1 more time preoperatively to discuss further operative details and to discuss CT scan findings. Copy of this report was sent to the referring provider    Caroleen Hamman, MD [facility] General Surgeon

## 2021-06-18 ENCOUNTER — Other Ambulatory Visit: Payer: Self-pay

## 2021-06-18 ENCOUNTER — Ambulatory Visit: Payer: 59 | Admitting: Surgery

## 2021-06-19 ENCOUNTER — Telehealth: Payer: Self-pay | Admitting: Surgery

## 2021-06-19 NOTE — Telephone Encounter (Signed)
Per Dr Dahlia Byes he does want him to be seen prior to surgery.

## 2021-06-19 NOTE — Telephone Encounter (Signed)
Patient is calling and is asking if he needs to r/s his appointment he missed yesterday wasn't sure if the appt was needed. Please call patient and advise.

## 2021-06-20 ENCOUNTER — Other Ambulatory Visit: Payer: Self-pay

## 2021-06-23 ENCOUNTER — Telehealth: Payer: Self-pay | Admitting: Surgery

## 2021-06-23 NOTE — Telephone Encounter (Signed)
Updated information regarding rescheduled surgery at Dr. Corlis Leak request.  Patient also scheduled for follow up on 07/14/21 prior to surgery.    Patient has been advised of Pre-Admission date/time, COVID Testing date and Surgery date.  Surgery Date: 07/17/21 Preadmission Testing Date: 07/10/21 (phone 8a-1p) Covid Testing Date: 07/15/21 @ 9:00 am  - patient advised to go to the [facility] ([place])   Patient has been made aware to call [phone], between 1-3:00pm the day before surgery, to find out what time to arrive for surgery.

## 2021-06-26 ENCOUNTER — Other Ambulatory Visit: Payer: 59

## 2021-07-01 ENCOUNTER — Other Ambulatory Visit: Payer: 59

## 2021-07-04 ENCOUNTER — Other Ambulatory Visit: Payer: Self-pay

## 2021-07-07 NOTE — Addendum Note (Signed)
Addended by: Caroleen Hamman F on: 07/07/2021 05:43 PM   Modules accepted: Orders, SmartSet

## 2021-07-10 ENCOUNTER — Other Ambulatory Visit
Admission: RE | Admit: 2021-07-10 | Discharge: 2021-07-10 | Disposition: A | Discharge status: Home / Self Care | Payer: 59 | Source: Ambulatory Visit | Attending: Surgery | Admitting: Surgery

## 2021-07-10 NOTE — Patient Instructions (Addendum)
Your procedure is scheduled on: Thursday 07/17/21 Report to the Registration Desk on the 1st floor of the [facility]. To find out your arrival time, please call [phone] between 1PM - 3PM on: Wednesday 07/16/21  REMEMBER: Instructions that are not followed completely may result in serious medical risk, up to and including death; or upon the discretion of your surgeon and anesthesiologist your surgery may need to be rescheduled.  Do not eat food after midnight the night before surgery.  No gum chewing, lozengers or hard candies.  You may however, drink CLEAR liquids up to 2 hours before you are scheduled to arrive for your surgery. Do not drink anything within 2 hours of your scheduled arrival time.  Clear liquids include: - water  - apple juice without pulp - gatorade (not RED, PURPLE, OR BLUE) - black coffee or tea (Do NOT add milk or creamers to the coffee or tea) Do NOT drink anything that is not on this list.  TAKE THESE MEDICATIONS THE MORNING OF SURGERY WITH A SIP OF WATER: amLODipine (NORVASC) 5 MG tablet  One week prior to surgery: Stop Anti-inflammatories (NSAIDS) such as Advil, Aleve, Ibuprofen, Motrin, Naproxen, Naprosyn and Aspirin based products such as Excedrin, Goodys Powder, BC Powder. Stop ANY OVER THE COUNTER supplements until after surgery. You may however, continue to take Tylenol if needed for pain up until the day of surgery.  No Alcohol for 24 hours before or after surgery.  No Smoking including e-cigarettes for 24 hours prior to surgery.  No chewable tobacco products for at least 6 hours prior to surgery.  No nicotine patches on the day of surgery.  Do not use any "recreational" drugs for at least a week prior to your surgery.  Please be advised that the combination of cocaine and anesthesia may have negative outcomes, up to and including death. If you test positive for cocaine, your surgery will be cancelled.  On the morning of surgery brush your  teeth with toothpaste and water, you may rinse your mouth with mouthwash if you wish. Do not swallow any toothpaste or mouthwash.  Use CHG wipes as directed on instruction sheet.  Do not wear jewelry.  Do not wear lotions, powders, or cologne.   Do not shave body from the neck down 48 hours prior to surgery just in case you cut yourself which could leave a site for infection.  Also, freshly shaved skin may become irritated if using the CHG soap.  Contact lenses, hearing aids and dentures may not be worn into surgery.  Do not bring valuables to the hospital. [facility] is not responsible for any missing/lost belongings or valuables.   Bring your C-PAP to the hospital with you in case you may have to spend the night.   Notify your doctor if there is any change in your medical condition (cold, fever, infection).  Wear comfortable clothing (specific to your surgery type) to the hospital.  After surgery, you can help prevent lung complications by doing breathing exercises.  Take deep breaths and cough every 1-2 hours.   When coughing or sneezing, hold a pillow firmly against your incision with both hands. This is called "splinting." Doing this helps protect your incision. It also decreases belly discomfort.  If you are being admitted to the hospital overnight, leave your suitcase in the car. After surgery it may be brought to your room.  If you are taking public transportation, you will need to have a responsible adult (18 years or  older) with you. Please confirm with your physician that it is acceptable to use public transportation.   Please call the [facility] at [phone] if you have any questions about these instructions.  Surgery Visitation Policy:  Patients undergoing a surgery or procedure may have one family member or support person with them as long as that person is not COVID-19 positive or experiencing its symptoms.  That person may remain in the  waiting area during the procedure and may rotate out with other people.  Inpatient Visitation:    Visiting hours are 7 a.m. to 8 p.m. Up to two visitors ages 16+ are allowed at one time in a patient room. The visitors may rotate out with other people during the day. Visitors must check out when they leave, or other visitors will not be allowed. One designated support person may remain overnight. The visitor must pass COVID-19 screenings, use hand sanitizer when entering and exiting the patient's room and wear a mask at all times, including in the patient's room. Patients must also wear a mask when staff or their visitor are in the room. Masking is required regardless of vaccination status.

## 2021-07-14 ENCOUNTER — Encounter: Payer: Self-pay | Admitting: Surgery

## 2021-07-14 ENCOUNTER — Other Ambulatory Visit: Payer: Self-pay

## 2021-07-14 ENCOUNTER — Ambulatory Visit (INDEPENDENT_AMBULATORY_CARE_PROVIDER_SITE_OTHER): Payer: 59 | Admitting: Surgery

## 2021-07-14 VITALS — BP 150/90 | HR 65 | Temp 98.0°F | Ht 68.0 in | Wt 230.0 lb

## 2021-07-14 DIAGNOSIS — K6389 Other specified diseases of intestine: Secondary | ICD-10-CM

## 2021-07-14 MED ORDER — BISACODYL 5 MG PO TBEC
DELAYED_RELEASE_TABLET | ORAL | 0 refills | Status: DC
  Filled 2021-07-14: qty 4, 1d supply, fill #0

## 2021-07-14 MED ORDER — POLYETHYLENE GLYCOL 3350 17 GM/SCOOP PO POWD
ORAL | 0 refills | Status: DC
  Filled 2021-07-14: qty 238, fill #0

## 2021-07-14 NOTE — Patient Instructions (Signed)
We have discussed removing a portion of your damaged colon through 4 small incisions today. We have scheduled this surgery for 07/17/21 at [facility] with Dr. Dahlia Byes. Please plan a hospital stay of 3-7 days for surgery and recovery time.  We will have you complete a bowel prep prior to your surgery. Please see information provided. You have also been given a (Blue) Pre-Care Sheet with more information regarding your particular surgery. Please review all information given.  Please call our office with any questions or concerns prior to your scheduled surgery.   Laparoscopic Colectomy Laparoscopic colectomy is surgery to remove part or all of the large intestine (colon). This procedure may be used to treat several conditions, including: Inflammation and infection of the colon (diverticulitis). Tumors or masses in the colon. Inflammatory bowel disease, such as Crohn disease or ulcerative colitis. Colectomy is an option when symptoms cannot be controlled with medicines. Bleeding from the colon that cannot be controlled by another method. Blockage or obstruction of the colon.  Tell a health care provider about: Any allergies you have. All medicines you are taking, including vitamins, herbs, eye drops, creams, and over-the-counter medicines. Any problems you or family members have had with anesthetic medicines. Any blood disorders you have. Any surgeries you have had. Any medical conditions you have. What are the risks? Generally, this is a safe procedure. However, problems may occur, including: Infection. Bleeding. Allergic reactions to medicines or dyes. Damage to other structures or organs. Leaking from where the colon was sewn together. Future blockage of the small intestines from scar tissue. Another surgery may be needed to repair this. Needing to convert to an open procedure. Complications such as damage to other organs or excessive bleeding may require the surgeon to convert from a  laparoscopic procedure to an open procedure. This involves making a larger incision in the abdomen.  What happens before the procedure? Staying hydrated Follow instructions from your health care provider about hydration, which may include: Up to 2 hours before the procedure - you may continue to drink clear liquids, such as water, clear fruit juice, black coffee, and plain tea.  Eating and drinking restrictions Follow instructions from your health care provider about eating and drinking, which may include: 8 hours before the procedure - stop eating heavy meals, meals with high fiber, or foods such as meat, fried foods, or fatty foods. 6 hours before the procedure - stop eating light meals or foods, such as toast or cereal. 6 hours before the procedure - stop drinking milk or drinks that contain milk. 2 hours before the procedure - stop drinking clear liquids.  Medicines Ask your health care provider about: Changing or stopping your regular medicines. This is especially important if you are taking diabetes medicines or blood thinners. Taking medicines such as aspirin and ibuprofen. These medicines can thin your blood. Do not take these medicines before your procedure if your health care provider instructs you not to. You may be given antibiotic medicine to clean out bacteria from your colon. Follow the directions carefully and take the medicine at the correct time. General instructions You may be prescribed an oral bowel prep to clean out your colon in preparation for the surgery: Follow instructions from your health care provider about how to do this. Do not eat or drink anything else after you have started the bowel prep, unless your health care provider tells you it is safe to do so. Do not use any products that contain nicotine or tobacco, such  as cigarettes and e-cigarettes. If you need help quitting, ask your health care provider. What happens during the procedure? To reduce your risk  of infection: Your health care team will wash or sanitize their hands. Your skin will be washed with soap. An IV tube will be inserted into one of your veins to deliver fluid and medication. You will be given one of the following: A medicine to help you relax (sedative). A medicine to make you fall asleep (general anesthetic). Small monitors will be connected to your body. They will be used to check your heart, blood pressure, and oxygen level. A breathing tube may be placed into your lungs during the procedure. A thin, flexible tube (catheter) will be placed into your bladder to drain urine. A tube may be placed through your nose and into your stomach to drain stomach fluids (nasogastric tube, or NG tube). Your abdomen will be filled with air so it expands. This gives the surgeon more room to operate and makes your organs easier to see. Several small cuts (incisions) will be made in your abdomen. A thin, lighted tube with a tiny camera on the end (laparoscope) will be put through one of the small incisions. The camera on the laparoscope will send a picture to a computer screen in the operating room. This will give the surgeon a good view inside your abdomen. Hollow tubes will be put through the other small incisions in your abdomen. The tools that are needed for the procedure will be put through these tubes. Clamps or staples will be put on both ends of the diseased part of the colon. The part of the intestine between the clamps or staples will be removed. If possible, the ends of the healthy colon that remain will be stitched (sutured) or stapled together to allow your body to pass waste (stool). Sometimes, the remaining colon cannot be stitched back together. If this is the case, a colostomy will be needed. If you need a colostomy: An opening to the outside of your body (stoma) will be made through your abdomen. The end of your colon will be brought to the opening. It will be stitched to the  skin. A bag will be attached to the opening. Stool will drain into this removable bag. The colostomy may be temporary or permanent. The incisions from the colectomy will be closed with sutures or staples. The procedure may vary among health care providers and hospitals. What happens after the procedure? Your blood pressure, heart rate, breathing rate, and blood oxygen level will be monitored until the medicines you were given have worn off. You will receive fluids through an IV tube until your bowels start to work properly. Once your bowels are working again, you will be given clear liquids first and then solid food as tolerated. You will be given medicines to control your pain and nausea, if needed. Do not drive for 24 hours if you were given a sedative. This information is not intended to replace advice given to you by your health care provider. Make sure you discuss any questions you have with your health care provider. Document Released: 11/21/2002 Document Revised: 06/01/2016 Document Reviewed: 06/01/2016 Elsevier Interactive Patient Education  2018 [employer].     Laparoscopic Colectomy, Care After This sheet gives you information about how to care for yourself after your procedure. Your health care provider may also give you more specific instructions. If you have problems or questions, contact your health care provider. What can I expect after  the procedure? After your procedure, it is common to have the following: Pain in your abdomen, especially in the incision areas. You will be given medicine to control the pain. Tiredness. This is a normal part of the recovery process. Your energy level will return to normal over the next several weeks. Changes in your bowel movements, such as constipation or needing to go more often. Talk with your health care provider about how to manage this.  Follow these instructions at home: Medicines Take over-the-counter and prescription medicines  only as told by your health care provider. Do not drive or use heavy machinery while taking prescription pain medicine. Do not drink alcohol while taking prescription pain medicine. If you were prescribed an antibiotic medicine, use it as told by your health care provider. Do not stop using the antibiotic even if you start to feel better. Incision care Follow instructions from your health care provider about how to take care of your incision areas. Make sure you: Keep your incisions clean and dry. Wash your hands with soap and water before and after applying medicine to the areas, and before and after changing your bandage (dressing). If soap and water are not available, use hand sanitizer. Change your dressing as told by your health care provider. Leave stitches (sutures), skin glue, or adhesive strips in place. These skin closures may need to stay in place for 2 weeks or longer. If adhesive strip edges start to loosen and curl up, you may trim the loose edges. Do not remove adhesive strips completely unless your health care provider tells you to do that. Do not wear tight clothing over the incisions. Tight clothing may rub and irritate the incision areas, which may cause the incisions to open. Do not take baths, swim, or use a hot tub until your health care provider approves. Ask your health care provider if you can take showers. You may only be allowed to take sponge baths for bathing. Check your incision area every day for signs of infection. Check for: More redness, swelling, or pain. More fluid or blood. Warmth. Pus or a bad smell. Activity Avoid lifting anything that is heavier than 10 lb (4.5 kg) for 2 weeks or until your health care provider says it is okay. You may resume normal activities as told by your health care provider. Ask your health care provider what activities are safe for you. Take rest breaks during the day as needed. Eating and drinking Follow instructions from your  health care provider about what you can eat after surgery. To prevent or treat constipation while you are taking prescription pain medicine, your health care provider may recommend that you: Drink enough fluid to keep your urine clear or pale yellow. Take over-the-counter or prescription medicines. Eat foods that are high in fiber, such as fresh fruits and vegetables, whole grains, and beans. Limit foods that are high in fat and processed sugars, such as fried and sweet foods. General instructions Ask your health care provider when you will need an appointment to get your sutures or staples removed. Keep all follow-up visits as told by your health care provider. This is important. Contact a health care provider if: You have more redness, swelling, or pain around your incisions. You have more fluid or blood coming from the incisions. Your incisions feel warm to the touch. You have pus or a bad smell coming from your incisions or your dressing. You have a fever. You have an incision that breaks open (edges not staying  together) after sutures or staples have been removed. Get help right away if: You develop a rash. You have chest pain or difficulty breathing. You have pain or swelling in your legs. You feel light-headed or you faint. Your abdomen swells (becomes distended). You have nausea or vomiting. You have blood in your stool (feces). This information is not intended to replace advice given to you by your health care provider. Make sure you discuss any questions you have with your health care provider. Document Released: 03/20/2005 Document Revised: 06/01/2016 Document Reviewed: 06/01/2016 Elsevier Interactive Patient Education  [employer].

## 2021-07-14 NOTE — Progress Notes (Signed)
Outpatient Surgical Follow Up  07/14/2021  Keith Mills is an 50 y.o. male.   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  Removed in a piece meal fashion and unable to determine resection margins.  He did have a CT scan of the abdomen pelvis personally.  Showing no evidence of distant metastatic disease or obvious primary tumors.  CBC was completely normal and CMP only show a creatinine of 1.55.  He has had some chronic renal insufficiency. He is able to perform more than 4 METS w/o SOB or C/P  Past Medical History:  Diagnosis Date   Hypertension     Past Surgical History:  Procedure Laterality Date   COLONOSCOPY N/A 04/08/2021   Procedure: COLONOSCOPY;  Surgeon: Jonathon Bellows, MD;  Location: [facility] ENDOSCOPY;  Service: Gastroenterology;  Laterality: N/A;    Family History  Problem Relation Age of Onset   Kidney failure Mother    Hypertension Mother    Breast cancer Mother    Prostate cancer Father 30   Heart disease Father    Hypertension Sister    Colon polyps Sister     Social History:  reports that he has never smoked. He has never used smokeless tobacco. He reports current alcohol use. He reports that he does not use drugs.  Allergies: No Known Allergies  Medications reviewed.    ROS Full ROS performed and is otherwise negative other than what is stated in HPI   BP (!) 150/90   Pulse 65   Temp 98 F (36.7 C)   Ht 5\' 8"  (1.727 m)   Wt 230 lb (104.3 kg)   SpO2 98%   BMI 34.97 kg/m   Physical Exam Vitals and nursing note reviewed. Exam conducted with a chaperone present.  Constitutional:      General: He is not in acute distress.    Appearance: Normal appearance. He is normal weight. He is not ill-appearing.  Eyes:     General: No scleral icterus.       Right eye: No discharge.        Left eye: No discharge.  Neck:      Vascular: No carotid bruit.  Cardiovascular:     Rate and Rhythm: Normal rate and regular rhythm.     Heart sounds: No murmur heard.   No friction rub.  Pulmonary:     Effort: Pulmonary effort is normal. No respiratory distress.     Breath sounds: Normal breath sounds. No stridor. No wheezing or rhonchi.  Abdominal:     General: Abdomen is flat. There is no distension.     Palpations: Abdomen is soft. There is no mass.     Tenderness: There is no abdominal tenderness. There is no guarding or rebound.     Hernia: No hernia is present.  Musculoskeletal:        General: Normal range of motion.     Cervical back: Normal range of motion and neck supple. No rigidity or tenderness.  Lymphadenopathy:     Cervical: No cervical adenopathy.  Skin:    General: Skin is warm and dry.     Capillary Refill: Capillary refill takes less than 2 seconds.  Neurological:     General: No focal deficit present.     Mental Status: He is alert and oriented to person, place, and time.  Psychiatric:  Mood and Affect: Mood normal.        Behavior: Behavior normal.        Thought Content: Thought content normal.        Judgment: Judgment normal.       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 Schedule for completion robotic left colectomy . Procedure discussed with patient in detail.  Risks, benefits and possible complications including but not limited to bleeding, infection, leak, re-interventions. He understands and wishes to proceed.  Time spent in this encounter was >40 minutes including personally reviewing images, counseling the patient and placing appropriate orders as well as documenting in the chart.  Caroleen Hamman, MD [facility] General Surgeon

## 2021-07-14 NOTE — H&P (View-Only) (Signed)
Outpatient Surgical Follow Up  07/14/2021  Keith Mills is an 50 y.o. male.   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  Removed in a piece meal fashion and unable to determine resection margins.  He did have a CT scan of the abdomen pelvis personally.  Showing no evidence of distant metastatic disease or obvious primary tumors.  CBC was completely normal and CMP only show a creatinine of 1.55.  He has had some chronic renal insufficiency. He is able to perform more than 4 METS w/o SOB or C/P  Past Medical History:  Diagnosis Date   Hypertension     Past Surgical History:  Procedure Laterality Date   COLONOSCOPY N/A 04/08/2021   Procedure: COLONOSCOPY;  Surgeon: Jonathon Bellows, MD;  Location: [facility] ENDOSCOPY;  Service: Gastroenterology;  Laterality: N/A;    Family History  Problem Relation Age of Onset   Kidney failure Mother    Hypertension Mother    Breast cancer Mother    Prostate cancer Father 57   Heart disease Father    Hypertension Sister    Colon polyps Sister     Social History:  reports that he has never smoked. He has never used smokeless tobacco. He reports current alcohol use. He reports that he does not use drugs.  Allergies: No Known Allergies  Medications reviewed.    ROS Full ROS performed and is otherwise negative other than what is stated in HPI   BP (!) 150/90   Pulse 65   Temp 98 F (36.7 C)   Ht 5\' 8"  (1.727 m)   Wt 230 lb (104.3 kg)   SpO2 98%   BMI 34.97 kg/m   Physical Exam Vitals and nursing note reviewed. Exam conducted with a chaperone present.  Constitutional:      General: He is not in acute distress.    Appearance: Normal appearance. He is normal weight. He is not ill-appearing.  Eyes:     General: No scleral icterus.       Right eye: No discharge.        Left eye: No discharge.  Neck:      Vascular: No carotid bruit.  Cardiovascular:     Rate and Rhythm: Normal rate and regular rhythm.     Heart sounds: No murmur heard.   No friction rub.  Pulmonary:     Effort: Pulmonary effort is normal. No respiratory distress.     Breath sounds: Normal breath sounds. No stridor. No wheezing or rhonchi.  Abdominal:     General: Abdomen is flat. There is no distension.     Palpations: Abdomen is soft. There is no mass.     Tenderness: There is no abdominal tenderness. There is no guarding or rebound.     Hernia: No hernia is present.  Musculoskeletal:        General: Normal range of motion.     Cervical back: Normal range of motion and neck supple. No rigidity or tenderness.  Lymphadenopathy:     Cervical: No cervical adenopathy.  Skin:    General: Skin is warm and dry.     Capillary Refill: Capillary refill takes less than 2 seconds.  Neurological:     General: No focal deficit present.     Mental Status: He is alert and oriented to person, place, and time.  Psychiatric:  Mood and Affect: Mood normal.        Behavior: Behavior normal.        Thought Content: Thought content normal.        Judgment: Judgment normal.       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 Schedule for completion robotic left colectomy . Procedure discussed with patient in detail.  Risks, benefits and possible complications including but not limited to bleeding, infection, leak, re-interventions. He understands and wishes to proceed.  Time spent in this encounter was >40 minutes including personally reviewing images, counseling the patient and placing appropriate orders as well as documenting in the chart.  Caroleen Hamman, MD [facility] General Surgeon

## 2021-07-15 ENCOUNTER — Encounter
Admission: RE | Admit: 2021-07-15 | Discharge: 2021-07-15 | Disposition: A | Discharge status: Home / Self Care | Payer: 59 | Source: Ambulatory Visit | Attending: Surgery | Admitting: Surgery

## 2021-07-15 ENCOUNTER — Other Ambulatory Visit
Admission: RE | Admit: 2021-07-15 | Discharge: 2021-07-15 | Disposition: A | Discharge status: Home / Self Care | Payer: 59 | Source: Ambulatory Visit | Attending: Surgery | Admitting: Surgery

## 2021-07-15 VITALS — Ht 68.0 in | Wt 230.0 lb

## 2021-07-15 DIAGNOSIS — Z01812 Encounter for preprocedural laboratory examination: Secondary | ICD-10-CM

## 2021-07-15 DIAGNOSIS — N189 Chronic kidney disease, unspecified: Secondary | ICD-10-CM | POA: Diagnosis not present

## 2021-07-15 DIAGNOSIS — N179 Acute kidney failure, unspecified: Secondary | ICD-10-CM | POA: Diagnosis not present

## 2021-07-15 DIAGNOSIS — D126 Benign neoplasm of colon, unspecified: Secondary | ICD-10-CM | POA: Diagnosis not present

## 2021-07-15 DIAGNOSIS — Z8371 Family history of colonic polyps: Secondary | ICD-10-CM | POA: Diagnosis not present

## 2021-07-15 DIAGNOSIS — I129 Hypertensive chronic kidney disease with stage 1 through stage 4 chronic kidney disease, or unspecified chronic kidney disease: Secondary | ICD-10-CM | POA: Diagnosis not present

## 2021-07-15 DIAGNOSIS — Z1152 Encounter for screening for COVID-19: Secondary | ICD-10-CM

## 2021-07-15 DIAGNOSIS — Z20822 Contact with and (suspected) exposure to covid-19: Secondary | ICD-10-CM | POA: Insufficient documentation

## 2021-07-15 DIAGNOSIS — K6389 Other specified diseases of intestine: Secondary | ICD-10-CM | POA: Diagnosis not present

## 2021-07-15 DIAGNOSIS — Z86010 Personal history of colonic polyps: Secondary | ICD-10-CM | POA: Diagnosis not present

## 2021-07-15 DIAGNOSIS — Z8042 Family history of malignant neoplasm of prostate: Secondary | ICD-10-CM | POA: Diagnosis not present

## 2021-07-15 DIAGNOSIS — Z8249 Family history of ischemic heart disease and other diseases of the circulatory system: Secondary | ICD-10-CM | POA: Diagnosis not present

## 2021-07-15 DIAGNOSIS — Z01818 Encounter for other preprocedural examination: Secondary | ICD-10-CM | POA: Insufficient documentation

## 2021-07-15 DIAGNOSIS — K639 Disease of intestine, unspecified: Secondary | ICD-10-CM | POA: Diagnosis present

## 2021-07-15 DIAGNOSIS — D72829 Elevated white blood cell count, unspecified: Secondary | ICD-10-CM | POA: Diagnosis not present

## 2021-07-15 DIAGNOSIS — D124 Benign neoplasm of descending colon: Secondary | ICD-10-CM | POA: Diagnosis not present

## 2021-07-15 DIAGNOSIS — Z841 Family history of disorders of kidney and ureter: Secondary | ICD-10-CM | POA: Diagnosis not present

## 2021-07-15 DIAGNOSIS — K660 Peritoneal adhesions (postprocedural) (postinfection): Secondary | ICD-10-CM | POA: Diagnosis not present

## 2021-07-15 DIAGNOSIS — Z01810 Encounter for preprocedural cardiovascular examination: Secondary | ICD-10-CM | POA: Diagnosis not present

## 2021-07-15 DIAGNOSIS — Z803 Family history of malignant neoplasm of breast: Secondary | ICD-10-CM | POA: Diagnosis not present

## 2021-07-15 LAB — TYPE AND SCREEN
ABO/RH(D): O POS
Antibody Screen: NEGATIVE

## 2021-07-15 LAB — SARS CORONAVIRUS 2 (TAT 6-24 HRS): SARS Coronavirus 2: NEGATIVE

## 2021-07-15 NOTE — Patient Instructions (Addendum)
Your procedure is scheduled on: Thursday July 17, 2021. Report to Day Surgery inside [facility] 2nd floor.  To find out your arrival time please call [phone] between 1PM - 3PM on Wednesday July 16, 2021.  Remember: Instructions that are not followed completely may result in serious medical risk,  up to and including death, or upon the discretion of your surgeon and anesthesiologist your  surgery may need to be rescheduled.     _X__ 1. Do not eat food after midnight the night before your procedure.                 No chewing gum or hard candies. You may drink clear liquids up to 2 hours                 before you are scheduled to arrive for your surgery- DO not drink clear                 liquids within 2 hours of the start of your surgery.                 Clear Liquids include:  water  __X__2. Follow Bowel prep per Dr. Dahlia Byes as instructed.   __X__3.  On the morning of surgery brush your teeth with toothpaste and water, you                may rinse your mouth with mouthwash if you wish.  Do not swallow any toothpaste or mouthwash.     _X__ 4.  No Alcohol for 24 hours before or after surgery.   _X__ 5.  Do Not Smoke or use e-cigarettes For 24 Hours Prior to Your Surgery.                 Do not use any chewable tobacco products for at least 6 hours prior to                 Surgery.  _X__  6.  Do not use any recreational drugs (marijuana, cocaine, heroin, ecstasy, MDMA or other)                For at least one week prior to your surgery.  Combination of these drugs with anesthesia                May have life threatening results.   __X__7.  Notify your doctor if there is any change in your medical condition      (cold, fever, infections).     Do not wear jewelry, make-up, hairpins, clips or nail polish. Do not wear lotions, powders, or perfumes. You may wear deodorant. Do not shave 48 hours prior to surgery. Men may shave face and neck. Do not  bring valuables to the hospital.    [facility] is not responsible for any belongings or valuables.  Contacts, dentures or bridgework may not be worn into surgery. Leave your suitcase in the car. After surgery it may be brought to your room. For patients admitted to the hospital, discharge time is determined by your treatment team.   Patients discharged the day of surgery will not be allowed to drive home.   Make arrangements for someone to be with you for the first 24 hours of your Same Day Discharge.   __X__ Take these medicines the morning of surgery with A SIP OF WATER:    1. amLODipine (NORVASC) 5 MG   2. tamsulosin (FLOMAX) 0.4 MG   3.  4.  5.  6.  ____ Fleet Enema (as directed)   __X__ Use CHG Soap (or wipes) as directed  ____ Use Benzoyl Peroxide Gel as instructed  ____ Use inhalers on the day of surgery  ____ Stop metformin 2 days prior to surgery    ____ Take 1/2 of usual insulin dose the night before surgery. No insulin the morning          of surgery.   ____ Call your PCP, cardiologist, or Pulmonologist if taking Coumadin/Plavix/aspirin and ask when to stop before your surgery.   __X__ One Week prior to surgery- Stop Anti-inflammatories such as Ibuprofen, Aleve, Advil, Motrin, meloxicam (MOBIC), diclofenac, etodolac, ketorolac, Toradol, Daypro, piroxicam, Goody's or BC powders. OK TO USE TYLENOL IF NEEDED   __X__ Stop supplements until after surgery.    ____ Bring C-Pap to the hospital.    If you have any questions regarding your pre-procedure instructions,  Please call Pre-admit Testing at [phone]

## 2021-07-16 ENCOUNTER — Telehealth: Payer: Self-pay | Admitting: Surgery

## 2021-07-16 NOTE — Telephone Encounter (Signed)
Outgoing call to both husband and wife, left detailed message on their voice mails. Dr. Dahlia Byes will be doing patient's case first on 07/17/21 and patient will need to arrive at 7:00 am at [facility].

## 2021-07-17 ENCOUNTER — Inpatient Hospital Stay: Payer: 59 | Admitting: Certified Registered"

## 2021-07-17 ENCOUNTER — Encounter
Admission: RE | Disposition: A | Discharge status: Home / Self Care | Payer: Self-pay | Source: Home / Self Care | Attending: Surgery

## 2021-07-17 ENCOUNTER — Inpatient Hospital Stay
Admission: RE | Admit: 2021-07-17 | Discharge: 2021-07-20 | DRG: 330 | Disposition: A | Discharge status: Home / Self Care | Payer: 59 | Attending: Surgery | Admitting: Surgery

## 2021-07-17 ENCOUNTER — Other Ambulatory Visit: Payer: Self-pay

## 2021-07-17 ENCOUNTER — Encounter: Payer: Self-pay | Admitting: Surgery

## 2021-07-17 DIAGNOSIS — D126 Benign neoplasm of colon, unspecified: Secondary | ICD-10-CM

## 2021-07-17 DIAGNOSIS — Z9049 Acquired absence of other specified parts of digestive tract: Secondary | ICD-10-CM

## 2021-07-17 DIAGNOSIS — Z20822 Contact with and (suspected) exposure to covid-19: Secondary | ICD-10-CM | POA: Diagnosis present

## 2021-07-17 DIAGNOSIS — Z803 Family history of malignant neoplasm of breast: Secondary | ICD-10-CM | POA: Diagnosis not present

## 2021-07-17 DIAGNOSIS — K6389 Other specified diseases of intestine: Secondary | ICD-10-CM

## 2021-07-17 DIAGNOSIS — N179 Acute kidney failure, unspecified: Secondary | ICD-10-CM | POA: Diagnosis present

## 2021-07-17 DIAGNOSIS — Z841 Family history of disorders of kidney and ureter: Secondary | ICD-10-CM

## 2021-07-17 DIAGNOSIS — I129 Hypertensive chronic kidney disease with stage 1 through stage 4 chronic kidney disease, or unspecified chronic kidney disease: Secondary | ICD-10-CM | POA: Diagnosis present

## 2021-07-17 DIAGNOSIS — N189 Chronic kidney disease, unspecified: Secondary | ICD-10-CM | POA: Diagnosis present

## 2021-07-17 DIAGNOSIS — K660 Peritoneal adhesions (postprocedural) (postinfection): Secondary | ICD-10-CM | POA: Diagnosis present

## 2021-07-17 DIAGNOSIS — D124 Benign neoplasm of descending colon: Principal | ICD-10-CM | POA: Diagnosis present

## 2021-07-17 DIAGNOSIS — Z8042 Family history of malignant neoplasm of prostate: Secondary | ICD-10-CM | POA: Diagnosis not present

## 2021-07-17 DIAGNOSIS — D72829 Elevated white blood cell count, unspecified: Secondary | ICD-10-CM | POA: Diagnosis not present

## 2021-07-17 DIAGNOSIS — Z8249 Family history of ischemic heart disease and other diseases of the circulatory system: Secondary | ICD-10-CM

## 2021-07-17 DIAGNOSIS — K639 Disease of intestine, unspecified: Secondary | ICD-10-CM | POA: Diagnosis present

## 2021-07-17 DIAGNOSIS — Z8371 Family history of colonic polyps: Secondary | ICD-10-CM | POA: Diagnosis not present

## 2021-07-17 LAB — CBC
HCT: 36.4 % — ABNORMAL LOW (ref 39.0–52.0)
Hemoglobin: 12.6 g/dL — ABNORMAL LOW (ref 13.0–17.0)
MCH: 30.8 pg (ref 26.0–34.0)
MCHC: 34.6 g/dL (ref 30.0–36.0)
MCV: 89 fL (ref 80.0–100.0)
Platelets: 191 10*3/uL (ref 150–400)
RBC: 4.09 MIL/uL — ABNORMAL LOW (ref 4.22–5.81)
RDW: 12.5 % (ref 11.5–15.5)
WBC: 13.4 10*3/uL — ABNORMAL HIGH (ref 4.0–10.5)
nRBC: 0 % (ref 0.0–0.2)

## 2021-07-17 LAB — ABO/RH: ABO/RH(D): O POS

## 2021-07-17 LAB — CREATININE, SERUM
Creatinine, Ser: 1.87 mg/dL — ABNORMAL HIGH (ref 0.61–1.24)
GFR, Estimated: 43 mL/min — ABNORMAL LOW (ref >60)

## 2021-07-17 SURGERY — COLECTOMY, SIGMOID, ROBOT-ASSISTED
Anesthesia: General | Laterality: Left

## 2021-07-17 MED ORDER — SODIUM CHLORIDE (PF) 0.9 % IJ SOLN
INTRAMUSCULAR | Status: AC
  Filled 2021-07-17: qty 50

## 2021-07-17 MED ORDER — CELECOXIB 200 MG PO CAPS: 200.0000 mg | ORAL_CAPSULE | ORAL | Status: AC

## 2021-07-17 MED ORDER — PROCHLORPERAZINE MALEATE 10 MG PO TABS
10.0000 mg | ORAL_TABLET | Freq: Four times a day (QID) | ORAL | Status: DC | PRN
  Filled 2021-07-17: qty 1

## 2021-07-17 MED ORDER — FENTANYL CITRATE (PF) 100 MCG/2ML IJ SOLN
INTRAMUSCULAR | Status: DC | PRN
  Administered 2021-07-17: 25 ug via INTRAVENOUS
  Administered 2021-07-17 (×3): 50 ug via INTRAVENOUS
  Administered 2021-07-17: 100 ug via INTRAVENOUS

## 2021-07-17 MED ORDER — SODIUM CHLORIDE (PF) 0.9 % IJ SOLN
INTRAMUSCULAR | Status: DC | PRN
  Administered 2021-07-17: 100 mL via INTRAMUSCULAR

## 2021-07-17 MED ORDER — SODIUM CHLORIDE 0.9 % IV SOLN
2.0000 g | INTRAVENOUS | Status: AC
  Administered 2021-07-17 (×2): 2 g via INTRAVENOUS

## 2021-07-17 MED ORDER — FENTANYL CITRATE (PF) 250 MCG/5ML IJ SOLN
INTRAMUSCULAR | Status: AC
  Filled 2021-07-17: qty 5

## 2021-07-17 MED ORDER — GABAPENTIN 300 MG PO CAPS
ORAL_CAPSULE | ORAL | Status: AC
  Administered 2021-07-17: 300 mg via ORAL
  Filled 2021-07-17: qty 1

## 2021-07-17 MED ORDER — SUGAMMADEX SODIUM 500 MG/5ML IV SOLN
INTRAVENOUS | Status: DC | PRN
  Administered 2021-07-17: 500 mg via INTRAVENOUS

## 2021-07-17 MED ORDER — ALVIMOPAN 12 MG PO CAPS
12.0000 mg | ORAL_CAPSULE | Freq: Once | ORAL | Status: AC
  Administered 2021-07-18: 12 mg via ORAL
  Filled 2021-07-17: qty 1

## 2021-07-17 MED ORDER — ONDANSETRON 4 MG PO TBDP: 4.0000 mg | ORAL_TABLET | Freq: Four times a day (QID) | ORAL | Status: DC | PRN

## 2021-07-17 MED ORDER — CHLORHEXIDINE GLUCONATE 0.12 % MT SOLN
OROMUCOSAL | Status: AC
  Administered 2021-07-17: 15 mL via OROMUCOSAL
  Filled 2021-07-17: qty 15

## 2021-07-17 MED ORDER — ORAL CARE MOUTH RINSE: 15.0000 mL | Freq: Once | OROMUCOSAL | Status: AC

## 2021-07-17 MED ORDER — CHLORHEXIDINE GLUCONATE CLOTH 2 % EX PADS: 6.0000 | MEDICATED_PAD | Freq: Once | CUTANEOUS | Status: DC

## 2021-07-17 MED ORDER — ONDANSETRON HCL 4 MG/2ML IJ SOLN
INTRAMUSCULAR | Status: DC | PRN
  Administered 2021-07-17 (×2): 4 mg via INTRAVENOUS

## 2021-07-17 MED ORDER — SODIUM CHLORIDE 0.9 % IV SOLN
2.0000 g | Freq: Two times a day (BID) | INTRAVENOUS | Status: AC
  Administered 2021-07-17 – 2021-07-18 (×2): 2 g via INTRAVENOUS
  Filled 2021-07-17 (×2): qty 2

## 2021-07-17 MED ORDER — KETAMINE HCL 50 MG/5ML IJ SOSY
PREFILLED_SYRINGE | INTRAMUSCULAR | Status: AC
  Filled 2021-07-17: qty 5

## 2021-07-17 MED ORDER — FAMOTIDINE 20 MG PO TABS: 20.0000 mg | ORAL_TABLET | Freq: Once | ORAL | Status: AC

## 2021-07-17 MED ORDER — ACETAMINOPHEN 10 MG/ML IV SOLN
INTRAVENOUS | Status: DC | PRN
  Administered 2021-07-17: 1000 mg via INTRAVENOUS

## 2021-07-17 MED ORDER — FAMOTIDINE 20 MG PO TABS
ORAL_TABLET | ORAL | Status: AC
  Administered 2021-07-17: 20 mg via ORAL
  Filled 2021-07-17: qty 1

## 2021-07-17 MED ORDER — ONDANSETRON HCL 4 MG/2ML IJ SOLN: 4.0000 mg | Freq: Once | INTRAMUSCULAR | Status: DC | PRN

## 2021-07-17 MED ORDER — PHENYLEPHRINE HCL (PRESSORS) 10 MG/ML IV SOLN
INTRAVENOUS | Status: DC | PRN
  Administered 2021-07-17: 80 ug via INTRAVENOUS
  Administered 2021-07-17 (×3): 160 ug via INTRAVENOUS
  Administered 2021-07-17: 80 ug via INTRAVENOUS
  Administered 2021-07-17: 160 ug via INTRAVENOUS

## 2021-07-17 MED ORDER — SODIUM CHLORIDE 0.9 % IV SOLN
INTRAVENOUS | Status: AC
  Filled 2021-07-17: qty 2

## 2021-07-17 MED ORDER — FENTANYL CITRATE (PF) 100 MCG/2ML IJ SOLN: 25.0000 ug | INTRAMUSCULAR | Status: DC | PRN

## 2021-07-17 MED ORDER — OXYCODONE HCL 5 MG PO TABS: 5.0000 mg | ORAL_TABLET | Freq: Once | ORAL | Status: DC | PRN

## 2021-07-17 MED ORDER — ACETAMINOPHEN 500 MG PO TABS: 1000.0000 mg | ORAL_TABLET | ORAL | Status: AC

## 2021-07-17 MED ORDER — CELECOXIB 200 MG PO CAPS
ORAL_CAPSULE | ORAL | Status: AC
  Administered 2021-07-17: 200 mg via ORAL
  Filled 2021-07-17: qty 1

## 2021-07-17 MED ORDER — ALVIMOPAN 12 MG PO CAPS
ORAL_CAPSULE | ORAL | Status: AC
  Filled 2021-07-17: qty 1

## 2021-07-17 MED ORDER — GABAPENTIN 300 MG PO CAPS: 300.0000 mg | ORAL_CAPSULE | ORAL | Status: AC

## 2021-07-17 MED ORDER — KETAMINE HCL 10 MG/ML IJ SOLN
INTRAMUSCULAR | Status: DC | PRN
  Administered 2021-07-17 (×3): 10 mg via INTRAVENOUS
  Administered 2021-07-17: 50 mg via INTRAVENOUS
  Administered 2021-07-17 (×2): 10 mg via INTRAVENOUS

## 2021-07-17 MED ORDER — PHENYLEPHRINE HCL-NACL 20-0.9 MG/250ML-% IV SOLN
INTRAVENOUS | Status: DC | PRN
  Administered 2021-07-17: 20 ug/min via INTRAVENOUS

## 2021-07-17 MED ORDER — ALBUMIN HUMAN 25 % IV SOLN
25.0000 g | Freq: Once | INTRAVENOUS | Status: DC
  Filled 2021-07-17 (×3): qty 100

## 2021-07-17 MED ORDER — SPY AGENT GREEN - (INDOCYANINE FOR INJECTION)
INTRAMUSCULAR | Status: DC | PRN
  Administered 2021-07-17 (×4): 2 mL via INTRAVENOUS

## 2021-07-17 MED ORDER — DEXMEDETOMIDINE (PRECEDEX) IN NS 20 MCG/5ML (4 MCG/ML) IV SYRINGE
PREFILLED_SYRINGE | INTRAVENOUS | Status: DC | PRN
  Administered 2021-07-17 (×2): 4 ug via INTRAVENOUS
  Administered 2021-07-17: 20 ug via INTRAVENOUS
  Administered 2021-07-17: 4 ug via INTRAVENOUS
  Administered 2021-07-17: 8 ug via INTRAVENOUS
  Administered 2021-07-17 (×2): 4 ug via INTRAVENOUS

## 2021-07-17 MED ORDER — SODIUM CHLORIDE 0.9 % IV SOLN: INTRAVENOUS | Status: DC

## 2021-07-17 MED ORDER — ROCURONIUM BROMIDE 100 MG/10ML IV SOLN
INTRAVENOUS | Status: DC | PRN
  Administered 2021-07-17 (×5): 20 mg via INTRAVENOUS
  Administered 2021-07-17: 100 mg via INTRAVENOUS
  Administered 2021-07-17: 20 mg via INTRAVENOUS

## 2021-07-17 MED ORDER — ALVIMOPAN 12 MG PO CAPS
12.0000 mg | ORAL_CAPSULE | ORAL | Status: AC
  Administered 2021-07-17: 12 mg via ORAL

## 2021-07-17 MED ORDER — SODIUM CHLORIDE 0.9 % IR SOLN
Status: DC | PRN
  Administered 2021-07-17: 1000 mL

## 2021-07-17 MED ORDER — BUPIVACAINE LIPOSOME 1.3 % IJ SUSP
INTRAMUSCULAR | Status: AC
  Filled 2021-07-17: qty 20

## 2021-07-17 MED ORDER — EPHEDRINE SULFATE 50 MG/ML IJ SOLN
INTRAMUSCULAR | Status: DC | PRN
  Administered 2021-07-17: 5 mg via INTRAVENOUS
  Administered 2021-07-17: 10 mg via INTRAVENOUS
  Administered 2021-07-17 (×2): 5 mg via INTRAVENOUS

## 2021-07-17 MED ORDER — HEPARIN SODIUM (PORCINE) 5000 UNIT/ML IJ SOLN
INTRAMUSCULAR | Status: AC
  Administered 2021-07-17: 5000 [IU] via SUBCUTANEOUS
  Filled 2021-07-17: qty 1

## 2021-07-17 MED ORDER — DEXMEDETOMIDINE (PRECEDEX) IN NS 20 MCG/5ML (4 MCG/ML) IV SYRINGE
PREFILLED_SYRINGE | INTRAVENOUS | Status: AC
  Filled 2021-07-17: qty 10

## 2021-07-17 MED ORDER — ACETAMINOPHEN 10 MG/ML IV SOLN
INTRAVENOUS | Status: AC
  Filled 2021-07-17: qty 100

## 2021-07-17 MED ORDER — HYDROMORPHONE HCL 1 MG/ML IJ SOLN: 0.5000 mg | INTRAMUSCULAR | Status: DC | PRN

## 2021-07-17 MED ORDER — ACETAMINOPHEN 10 MG/ML IV SOLN: 1000.0000 mg | Freq: Once | INTRAVENOUS | Status: DC | PRN

## 2021-07-17 MED ORDER — MIDAZOLAM HCL 2 MG/2ML IJ SOLN
INTRAMUSCULAR | Status: AC
  Filled 2021-07-17: qty 2

## 2021-07-17 MED ORDER — 0.9 % SODIUM CHLORIDE (POUR BTL) OPTIME
TOPICAL | Status: DC | PRN
  Administered 2021-07-17: 1000 mL

## 2021-07-17 MED ORDER — PANTOPRAZOLE SODIUM 40 MG IV SOLR
40.0000 mg | Freq: Every day | INTRAVENOUS | Status: DC
  Administered 2021-07-17 – 2021-07-19 (×3): 40 mg via INTRAVENOUS
  Filled 2021-07-17 (×3): qty 40

## 2021-07-17 MED ORDER — CHLORHEXIDINE GLUCONATE 0.12 % MT SOLN: 15.0000 mL | Freq: Once | OROMUCOSAL | Status: AC

## 2021-07-17 MED ORDER — TAMSULOSIN HCL 0.4 MG PO CAPS
0.4000 mg | ORAL_CAPSULE | Freq: Every day | ORAL | Status: DC
  Administered 2021-07-18 – 2021-07-20 (×3): 0.4 mg via ORAL
  Filled 2021-07-17 (×4): qty 1

## 2021-07-17 MED ORDER — PHENYLEPHRINE HCL-NACL 20-0.9 MG/250ML-% IV SOLN
INTRAVENOUS | Status: AC
  Filled 2021-07-17: qty 250

## 2021-07-17 MED ORDER — PHENYLEPHRINE HCL (PRESSORS) 10 MG/ML IV SOLN
INTRAVENOUS | Status: AC
  Filled 2021-07-17: qty 2

## 2021-07-17 MED ORDER — MORPHINE SULFATE (PF) 2 MG/ML IV SOLN: 2.0000 mg | INTRAVENOUS | Status: DC | PRN

## 2021-07-17 MED ORDER — HYDROMORPHONE HCL 1 MG/ML IJ SOLN
INTRAMUSCULAR | Status: AC
  Filled 2021-07-17: qty 1

## 2021-07-17 MED ORDER — LACTATED RINGERS IV SOLN: INTRAVENOUS | Status: DC

## 2021-07-17 MED ORDER — PROPOFOL 10 MG/ML IV BOLUS
INTRAVENOUS | Status: AC
  Filled 2021-07-17: qty 20

## 2021-07-17 MED ORDER — HYDROMORPHONE HCL 1 MG/ML IJ SOLN
INTRAMUSCULAR | Status: DC | PRN
  Administered 2021-07-17: 1 mg via INTRAVENOUS

## 2021-07-17 MED ORDER — ACETAMINOPHEN 500 MG PO TABS
ORAL_TABLET | ORAL | Status: AC
  Administered 2021-07-17: 1000 mg via ORAL
  Filled 2021-07-17: qty 2

## 2021-07-17 MED ORDER — OXYCODONE HCL 5 MG PO TABS: 5.0000 mg | ORAL_TABLET | ORAL | Status: DC | PRN

## 2021-07-17 MED ORDER — OXYCODONE HCL 5 MG/5ML PO SOLN: 5.0000 mg | Freq: Once | ORAL | Status: DC | PRN

## 2021-07-17 MED ORDER — GLYCOPYRROLATE 0.2 MG/ML IJ SOLN
INTRAMUSCULAR | Status: DC | PRN
  Administered 2021-07-17: .2 mg via INTRAVENOUS

## 2021-07-17 MED ORDER — LIDOCAINE HCL (CARDIAC) PF 100 MG/5ML IV SOSY
PREFILLED_SYRINGE | INTRAVENOUS | Status: DC | PRN
  Administered 2021-07-17: 40 mg via INTRAVENOUS
  Administered 2021-07-17: 100 mg via INTRAVENOUS
  Administered 2021-07-17 (×5): 20 mg via INTRAVENOUS

## 2021-07-17 MED ORDER — AMLODIPINE BESYLATE 5 MG PO TABS
5.0000 mg | ORAL_TABLET | Freq: Every day | ORAL | Status: DC
  Administered 2021-07-18 – 2021-07-20 (×3): 5 mg via ORAL
  Filled 2021-07-17 (×3): qty 1

## 2021-07-17 MED ORDER — MIDAZOLAM HCL 2 MG/2ML IJ SOLN
INTRAMUSCULAR | Status: DC | PRN
  Administered 2021-07-17: 2 mg via INTRAVENOUS

## 2021-07-17 MED ORDER — ACETAMINOPHEN 500 MG PO TABS
1000.0000 mg | ORAL_TABLET | Freq: Four times a day (QID) | ORAL | Status: DC
  Administered 2021-07-18 – 2021-07-20 (×10): 1000 mg via ORAL
  Filled 2021-07-17 (×12): qty 2

## 2021-07-17 MED ORDER — PREGABALIN 50 MG PO CAPS
100.0000 mg | ORAL_CAPSULE | Freq: Three times a day (TID) | ORAL | Status: DC
  Administered 2021-07-17 – 2021-07-20 (×8): 100 mg via ORAL
  Filled 2021-07-17 (×8): qty 2

## 2021-07-17 MED ORDER — PROPOFOL 10 MG/ML IV BOLUS
INTRAVENOUS | Status: DC | PRN
  Administered 2021-07-17: 180 mg via INTRAVENOUS

## 2021-07-17 MED ORDER — ENOXAPARIN SODIUM 40 MG/0.4ML IJ SOSY
40.0000 mg | PREFILLED_SYRINGE | INTRAMUSCULAR | Status: DC
  Administered 2021-07-18 – 2021-07-20 (×3): 40 mg via SUBCUTANEOUS
  Filled 2021-07-17 (×3): qty 0.4

## 2021-07-17 MED ORDER — FENTANYL CITRATE (PF) 100 MCG/2ML IJ SOLN
INTRAMUSCULAR | Status: AC
  Filled 2021-07-17: qty 2

## 2021-07-17 MED ORDER — HEPARIN SODIUM (PORCINE) 5000 UNIT/ML IJ SOLN: 5000.0000 [IU] | Freq: Once | INTRAMUSCULAR | Status: AC

## 2021-07-17 MED ORDER — ONDANSETRON HCL 4 MG/2ML IJ SOLN: 4.0000 mg | Freq: Four times a day (QID) | INTRAMUSCULAR | Status: DC | PRN

## 2021-07-17 MED ORDER — DEXAMETHASONE SODIUM PHOSPHATE 10 MG/ML IJ SOLN
INTRAMUSCULAR | Status: DC | PRN
  Administered 2021-07-17: 10 mg via INTRAVENOUS

## 2021-07-17 MED ORDER — PROCHLORPERAZINE EDISYLATE 10 MG/2ML IJ SOLN: 5.0000 mg | Freq: Four times a day (QID) | INTRAMUSCULAR | Status: DC | PRN

## 2021-07-17 MED ORDER — ALBUMIN HUMAN 25 % IV SOLN
25.0000 g | Freq: Once | INTRAVENOUS | Status: AC
  Filled 2021-07-17: qty 100

## 2021-07-17 MED ORDER — BUPIVACAINE-EPINEPHRINE (PF) 0.25% -1:200000 IJ SOLN
INTRAMUSCULAR | Status: AC
  Filled 2021-07-17: qty 30

## 2021-07-17 SURGICAL SUPPLY — 109 items
BAG LAPAROSCOPIC 12 15 PORT 16 (BASKET) ×2 IMPLANT
BAG RETRIEVAL 12/15 (BASKET) ×3
CANNULA REDUC XI 12-8 STAPL (CANNULA) ×1
CANNULA REDUCER 12-8 DVNC XI (CANNULA) ×2 IMPLANT
CATH ROBINSON RED A/P 16FR (CATHETERS) ×3 IMPLANT
CHLORAPREP W/TINT 26 (MISCELLANEOUS) ×3 IMPLANT
CNTNR SPEC 2.5X3XGRAD LEK (MISCELLANEOUS) ×2
CONT SPEC 4OZ STER OR WHT (MISCELLANEOUS) ×1
CONTAINER SPEC 2.5X3XGRAD LEK (MISCELLANEOUS) ×2 IMPLANT
COVER MAYO STAND REUSABLE (DRAPES) ×6 IMPLANT
COVER TIP SHEARS 8 DVNC (MISCELLANEOUS) ×2 IMPLANT
COVER TIP SHEARS 8MM DA VINCI (MISCELLANEOUS) ×1
DECANTER SPIKE VIAL GLASS SM (MISCELLANEOUS) ×3 IMPLANT
DEFOGGER SCOPE WARMER CLEARIFY (MISCELLANEOUS) ×3 IMPLANT
DERMABOND ADVANCED (GAUZE/BANDAGES/DRESSINGS) ×1
DERMABOND ADVANCED .7 DNX12 (GAUZE/BANDAGES/DRESSINGS) ×2 IMPLANT
DRAPE 3/4 80X56 (DRAPES) ×3 IMPLANT
DRAPE ARM DVNC X/XI (DISPOSABLE) ×8 IMPLANT
DRAPE COLUMN DVNC XI (DISPOSABLE) ×2 IMPLANT
DRAPE DA VINCI XI ARM (DISPOSABLE) ×4
DRAPE DA VINCI XI COLUMN (DISPOSABLE) ×1
DRAPE LEGGINS SURG 28X43 STRL (DRAPES) ×3 IMPLANT
DRAPE ROBOT W/ LEGGING 30X125 (DRAPES) IMPLANT
DRAPE UNDER BUTTOCK W/FLU (DRAPES) ×3 IMPLANT
DRSG OPSITE POSTOP 3X4 (GAUZE/BANDAGES/DRESSINGS) ×12 IMPLANT
DRSG OPSITE POSTOP 4X6 (GAUZE/BANDAGES/DRESSINGS) ×3 IMPLANT
ELECT BLADE 6.5 EXT (BLADE) ×3 IMPLANT
ELECT CAUTERY BLADE 6.4 (BLADE) ×3 IMPLANT
ELECT REM PT RETURN 9FT ADLT (ELECTROSURGICAL) ×3
ELECTRODE REM PT RTRN 9FT ADLT (ELECTROSURGICAL) ×2 IMPLANT
GAUZE 4X4 16PLY ~~LOC~~+RFID DBL (SPONGE) ×3 IMPLANT
GLOVE SURG ENC MOIS LTX SZ7 (GLOVE) ×9 IMPLANT
GOWN STRL REUS W/ TWL LRG LVL3 (GOWN DISPOSABLE) ×16 IMPLANT
GOWN STRL REUS W/TWL LRG LVL3 (GOWN DISPOSABLE) ×8
GRASPER LAPSCPC 5X45 DSP (INSTRUMENTS) ×3 IMPLANT
HANDLE YANKAUER SUCT BULB TIP (MISCELLANEOUS) IMPLANT
IRRIGATION STRYKERFLOW (MISCELLANEOUS) ×2 IMPLANT
IRRIGATOR STRYKERFLOW (MISCELLANEOUS) ×3
IV NS 1000ML (IV SOLUTION) ×1
IV NS 1000ML BAXH (IV SOLUTION) ×2 IMPLANT
KIT IMAGING PINPOINTPAQ (MISCELLANEOUS) ×3 IMPLANT
KIT PINK PAD W/HEAD ARE REST (MISCELLANEOUS) ×3
KIT PINK PAD W/HEAD ARM REST (MISCELLANEOUS) ×2 IMPLANT
LABEL OR SOLS (LABEL) ×3 IMPLANT
MANIFOLD NEPTUNE II (INSTRUMENTS) ×3 IMPLANT
NEEDLE HYPO 22GX1.5 SAFETY (NEEDLE) ×3 IMPLANT
NS IRRIG 500ML POUR BTL (IV SOLUTION) ×3 IMPLANT
OBTURATOR OPTICAL STANDARD 8MM (TROCAR) ×1
OBTURATOR OPTICAL STND 8 DVNC (TROCAR) ×2
OBTURATOR OPTICALSTD 8 DVNC (TROCAR) ×2 IMPLANT
PACK COLON CLEAN CLOSURE (MISCELLANEOUS) ×3 IMPLANT
PACK LAP CHOLECYSTECTOMY (MISCELLANEOUS) ×3 IMPLANT
PAD PREP 24X41 OB/GYN DISP (PERSONAL CARE ITEMS) ×3 IMPLANT
PENCIL ELECTRO HAND CTR (MISCELLANEOUS) ×3 IMPLANT
PORT ACCESS TROCAR AIRSEAL 5 (TROCAR) ×3 IMPLANT
RELOAD STAPLER 2.5X60 WHT DVNC (STAPLE) IMPLANT
RELOAD STAPLER 3.5X45 BLU DVNC (STAPLE) IMPLANT
RELOAD STAPLER 3.5X60 BLU DVNC (STAPLE) ×4 IMPLANT
SEAL CANN UNIV 5-8 DVNC XI (MISCELLANEOUS) ×6 IMPLANT
SEAL XI 5MM-8MM UNIVERSAL (MISCELLANEOUS) ×3
SEALER VESSEL DA VINCI XI (MISCELLANEOUS) ×1
SEALER VESSEL EXT DVNC XI (MISCELLANEOUS) ×2 IMPLANT
SET TRI-LUMEN FLTR TB AIRSEAL (TUBING) ×3 IMPLANT
SOL PREP PVP 2OZ (MISCELLANEOUS) ×3
SOLUTION ELECTROLUBE (MISCELLANEOUS) ×3 IMPLANT
SOLUTION PREP PVP 2OZ (MISCELLANEOUS) ×2 IMPLANT
SPONGE T-LAP 18X18 ~~LOC~~+RFID (SPONGE) ×9 IMPLANT
SPONGE T-LAP 4X18 ~~LOC~~+RFID (SPONGE) ×3 IMPLANT
STAPLER 45 DA VINCI SURE FORM (STAPLE)
STAPLER 45 SUREFORM DVNC (STAPLE) IMPLANT
STAPLER 60 DA VINCI SURE FORM (STAPLE) ×1
STAPLER 60 SUREFORM DVNC (STAPLE) ×2 IMPLANT
STAPLER CANNULA SEAL DVNC XI (STAPLE) ×2 IMPLANT
STAPLER CANNULA SEAL XI (STAPLE) ×1
STAPLER CIRCULAR MANUAL XL 25 (STAPLE) IMPLANT
STAPLER CIRCULAR MANUAL XL 29 (STAPLE) IMPLANT
STAPLER CIRCULAR MANUAL XL 33 (STAPLE) IMPLANT
STAPLER RELOAD 2.5X60 WHITE (STAPLE)
STAPLER RELOAD 2.5X60 WHT DVNC (STAPLE)
STAPLER RELOAD 3.5X45 BLU DVNC (STAPLE)
STAPLER RELOAD 3.5X45 BLUE (STAPLE)
STAPLER RELOAD 3.5X60 BLU DVNC (STAPLE) ×4
STAPLER RELOAD 3.5X60 BLUE (STAPLE) ×2
STAPLER SKIN PROX 35W (STAPLE) ×3 IMPLANT
SURGILUBE 2OZ TUBE FLIPTOP (MISCELLANEOUS) ×3 IMPLANT
SUT DVC VLOC 3-0 CL 6 P-12 (SUTURE) IMPLANT
SUT MNCRL AB 4-0 PS2 18 (SUTURE) IMPLANT
SUT PDS AB 0 CT1 27 (SUTURE) ×6 IMPLANT
SUT SILK 2 0 (SUTURE) ×1
SUT SILK 2 0 SH (SUTURE) ×9 IMPLANT
SUT SILK 2-0 30XBRD TIE 12 (SUTURE) ×2 IMPLANT
SUT SILK 3 0 SH 30 (SUTURE) ×3 IMPLANT
SUT SILK 3-0 (SUTURE) IMPLANT
SUT V-LOC 90 ABS 3-0 VLT  V-20 (SUTURE) ×1
SUT V-LOC 90 ABS 3-0 VLT V-20 (SUTURE) ×2 IMPLANT
SUT VIC AB 2-0 SH 27 (SUTURE) ×1
SUT VIC AB 2-0 SH 27XBRD (SUTURE) ×2 IMPLANT
SUT VICRYL 0 AB UR-6 (SUTURE) IMPLANT
SUT VLOC 90 6 CV-15 VIOLET (SUTURE) ×9 IMPLANT
SUT VLOC 90 S/L VL9 GS22 (SUTURE) IMPLANT
SYR 20ML LL LF (SYRINGE) ×6 IMPLANT
SYR TOOMEY IRRIG 70ML (MISCELLANEOUS) ×3
SYRINGE TOOMEY IRRIG 70ML (MISCELLANEOUS) ×2 IMPLANT
SYS TROCAR 1.5-3 SLV ABD GEL (ENDOMECHANICALS) ×3
SYSTEM TROCR 1.5-3 SLV ABD GEL (ENDOMECHANICALS) ×2 IMPLANT
TOWEL OR 17X26 4PK STRL BLUE (TOWEL DISPOSABLE) ×3 IMPLANT
TRAY FOLEY MTR SLVR 16FR STAT (SET/KITS/TRAYS/PACK) ×3 IMPLANT
TRAY FOLEY SLVR 16FR LF STAT (SET/KITS/TRAYS/PACK) ×3 IMPLANT
WATER STERILE IRR 500ML POUR (IV SOLUTION) ×3 IMPLANT

## 2021-07-17 NOTE — Op Note (Signed)
PROCEDURES: 1. Robotic assisted Laparoscopic Left colectomy with hand sewn anastomosis 2. Robotic Laparoscopic takedown of splenic flexure 3. Check of pedicle graft ( anastomosis ) using ICG    Pre-operative Diagnosis: Polyp w High grade dysplasia  Post-operative Diagnosis:   Surgeon: Marjory Lies Myleen Brailsford   Assistants: Otho Ket PA-C. ( required for the anastomosis and for exposure)  Anesthesia: General endotracheal anesthesia  ASA Class: 2   Surgeon: Caroleen Hamman , MD FACS  Anesthesia: Gen. with endotracheal tube   Findings:Tension free anastomosis with very good perfusion and negative intraoperative leak   Estimated Blood Loss: 20cc                Specimens: colon       Complications: none         Condition: stable  Procedure Details  The patient was seen again in the Holding Room. The benefits, complications, treatment options, and expected outcomes were discussed with the patient. The risks of bleeding, infection, recurrence of symptoms, failure to resolve symptoms, anastomotic leak, bowel injury, any of which could require further surgery were reviewed with the patient.   The patient was taken to Operating Room, identified  and the procedure verified.  A Time Out was held and the above information confirmed.  Prior to the induction of general anesthesia, antibiotic prophylaxis was administered. VTE prophylaxis was in place. General endotracheal anesthesia was then administered and tolerated well. After the induction, the abdomen was prepped with Chloraprep and draped in the sterile fashion. The patient was positioned in lithotomy position. 4 cm incision was created Left lower quadrant. The abdominal cavity was entered under direct visualization and the Mini GelPort device was placed and pneumoperitoneum was obtained, no hemodynamic changes were apparent. . Three 8 mm robotic ports were placed under direct visualization and an additional 5 mm assist port was placed. Patient was  positioned in steep trendelenburg and left side up. Robot was brought to the field and docked in the standard fashion. WE maintained visualization of our instruments at all times and avoided any collision between arms. I scrubbed out and went to the console. There were some  adhesions from sigmoid to the abdominal wall that where lysed in the standard fashion with the scissors.   We identified the takeoff of the Left colic artery dissected the pedicle and divided using vessel sealer  in the standard fashion.   Using the sealer we were able to divide the mesocolon and and also divided the mesentery of the descending colon we mobilized the descending colon IN a medial to lateral fashion. We preserved the ureter at all times. Pelvic dissection was performed in a standard fashion to gain for length. Some of  THe mesorectum was divided with the vessel sealer. We dissected the distal sigmoid circumferentially and We divided at the distal sigmoid with standard 60 mm blue load  The white line of Toldt was identified and divided and  We were also able to mobilize the splenic flexure using scisors in the standard fashion. . We used ICG green to make sure the distal descneding colon had good perfusion. We made sure we had adequate reach so the anastomosis could be performed tension free. The  greater omentum was divided from the T. Colon to gain length. A end to end Anastomosis was  created between the Transverse colon and sigmoid colon in an end to end fashion Using 3-0 Vlock, posterior layer w simple running suture and anterior one with running Connel. A leak test  was performed inflating the colon with a Toomey syringe and a rubber catheter. No evidence of leak was observed. There was also adequate hemostasis. The specimen was removed in a bag. The robot was undocked and  I scrubbed back in.   All the laparoscopic ports were removed and a second look showed no evidence of any bleeding or any other  injuries.  Liposomal marcaine was infiltrated at all incision sites in a full thickness fashion.  We changed gloves and  close the  abdomen with two -0 PDS sutures in a running fashion to close the anterior and posterior fascia respectively. The skin incisions were closed with 4-0 Monocryl. Dermabond was used to coat all the skin incisions. Needle and laparotomy count were correct and there were no immediate complications.  Caroleen Hamman, MD, FACS

## 2021-07-17 NOTE — Anesthesia Procedure Notes (Signed)
Procedure Name: Intubation Date/Time: 07/17/2021 8:44 AM Performed by: Kelton Pillar, CRNA Pre-anesthesia Checklist: Patient identified, Emergency Drugs available, Suction available and Patient being monitored Patient Re-evaluated:Patient Re-evaluated prior to induction Oxygen Delivery Method: Circle system utilized Preoxygenation: Pre-oxygenation with 100% oxygen Induction Type: IV induction Ventilation: Mask ventilation without difficulty Laryngoscope Size: McGraph and 3 Grade View: Grade I Tube type: Oral Tube size: 7.0 mm Number of attempts: 1 Airway Equipment and Method: Stylet and Oral airway Placement Confirmation: ETT inserted through vocal cords under direct vision, positive ETCO2, breath sounds checked- equal and bilateral and CO2 detector Secured at: 21 cm Tube secured with: Tape Dental Injury: Teeth and Oropharynx as per pre-operative assessment

## 2021-07-17 NOTE — Transfer of Care (Signed)
Immediate Anesthesia Transfer of Care Note  Patient: Keith Mills  Procedure(s) Performed: XI ROBOT ASSISTED SIGMOID COLECTOMY, left with Edison Simon, PA-C to assist (Left) INDOCYANINE GREEN FLUORESCENCE IMAGING (ICG)  Patient Location: PACU  Anesthesia Type:General  Level of Consciousness: drowsy  Airway & Oxygen Therapy: Patient Spontanous Breathing and Patient connected to face mask oxygen  Post-op Assessment: Report given to RN and Post -op Vital signs reviewed and stable  Post vital signs: Reviewed and stable  Last Vitals:  Vitals Value Taken Time  BP 106/63 07/17/21 1645  Temp 36.7 C 07/17/21 1636  Pulse 92 07/17/21 1645  Resp 14 07/17/21 1645  SpO2 96 % 07/17/21 1645  Vitals shown include unvalidated device data.  Last Pain:  Vitals:   07/17/21 1636  TempSrc:   PainSc: Asleep         Complications: No notable events documented.

## 2021-07-17 NOTE — Interval H&P Note (Signed)
History and Physical Interval Note:  07/17/2021 8:11 AM  Keith Mills  has presented today for surgery, with the diagnosis of colon mass.  The various methods of treatment have been discussed with the patient and family. After consideration of risks, benefits and other options for treatment, the patient has consented to  Procedure(s) with comments: XI ROBOT ASSISTED SIGMOID COLECTOMY, left with Edison Simon, PA-C to assist (Left) - Provider requesting 3 hours / 180 minutes for procedure. as a surgical intervention.  The patient's history has been reviewed, patient examined, no change in status, stable for surgery.  I have reviewed the patient's chart and labs.  Questions were answered to the patient's satisfaction.     [place]

## 2021-07-17 NOTE — Anesthesia Preprocedure Evaluation (Signed)
Anesthesia Evaluation  Patient identified by MRN, date of birth, ID band Patient awake    Reviewed: Allergy & Precautions, NPO status , Patient's Chart, lab work & pertinent test results  History of Anesthesia Complications Negative for: history of anesthetic complications  Airway Mallampati: III  TM Distance: >3 FB Neck ROM: Full    Dental no notable dental hx. (+) Teeth Intact   Pulmonary neg pulmonary ROS, neg sleep apnea, neg COPD, Patient abstained from smoking.Not current smoker,    Pulmonary exam normal breath sounds clear to auscultation       Cardiovascular Exercise Tolerance: Good METShypertension, Pt. on medications (-) CAD and (-) Past MI (-) dysrhythmias  Rhythm:Regular Rate:Normal - Systolic murmurs    Neuro/Psych negative neurological ROS  negative psych ROS   GI/Hepatic neg GERD  ,(+)     (-) substance abuse  ,   Endo/Other  neg diabetes  Renal/GU negative Renal ROS     Musculoskeletal   Abdominal   Peds  Hematology   Anesthesia Other Findings Past Medical History: No date: Hypertension  Reproductive/Obstetrics                             Anesthesia Physical Anesthesia Plan  ASA: 2  Anesthesia Plan: General   Post-op Pain Management:    Induction: Intravenous  PONV Risk Score and Plan: 4 or greater and Ondansetron, Dexamethasone, Midazolam, Treatment may vary due to age or medical condition and Aprepitant  Airway Management Planned: Oral ETT  Additional Equipment: None  Intra-op Plan:   Post-operative Plan: Extubation in OR  Informed Consent: I have reviewed the patients History and Physical, chart, labs and discussed the procedure including the risks, benefits and alternatives for the proposed anesthesia with the patient or authorized representative who has indicated his/her understanding and acceptance.     Dental advisory given  Plan Discussed  with: CRNA and Surgeon  Anesthesia Plan Comments: (Discussed risks of anesthesia with patient, including PONV, sore throat, lip/dental damage. Rare risks discussed as well, such as cardiorespiratory and neurological sequelae, and allergic reactions. Discussed the role of CRNA in patient's perioperative care. Patient understands.)        Anesthesia Quick Evaluation

## 2021-07-18 LAB — BASIC METABOLIC PANEL
Anion gap: 7 (ref 5–15)
BUN: 19 mg/dL (ref 6–20)
CO2: 24 mmol/L (ref 22–32)
Calcium: 8.7 mg/dL — ABNORMAL LOW (ref 8.9–10.3)
Chloride: 107 mmol/L (ref 98–111)
Creatinine, Ser: 1.69 mg/dL — ABNORMAL HIGH (ref 0.61–1.24)
GFR, Estimated: 49 mL/min — ABNORMAL LOW (ref >60)
Glucose, Bld: 108 mg/dL — ABNORMAL HIGH (ref 70–99)
Potassium: 4.2 mmol/L (ref 3.5–5.1)
Sodium: 138 mmol/L (ref 135–145)

## 2021-07-18 LAB — CBC
HCT: 35.1 % — ABNORMAL LOW (ref 39.0–52.0)
Hemoglobin: 11.8 g/dL — ABNORMAL LOW (ref 13.0–17.0)
MCH: 30.2 pg (ref 26.0–34.0)
MCHC: 33.6 g/dL (ref 30.0–36.0)
MCV: 89.8 fL (ref 80.0–100.0)
Platelets: 184 10*3/uL (ref 150–400)
RBC: 3.91 MIL/uL — ABNORMAL LOW (ref 4.22–5.81)
RDW: 12.8 % (ref 11.5–15.5)
WBC: 12.9 10*3/uL — ABNORMAL HIGH (ref 4.0–10.5)
nRBC: 0 % (ref 0.0–0.2)

## 2021-07-18 NOTE — Plan of Care (Signed)
Continuing with plan of care. 

## 2021-07-18 NOTE — Progress Notes (Signed)
[facility] Day(s): 1.   Post op day(s): 1 Day Post-Op.   Interval History:  Patient seen and examined No acute events or new complaints overnight.  Patient reports she is doing well; some incisional soreness but other no pain No fever, chills, nausea, emesis Labs from overnight showed leukocytosis to 13.4K; likely reactive from OR Hgb stable at 12.6 He does have some degree of AKI; sCr - 1.87; UO - 2.4L He is on FLD; but has not had anything to eat since surgery No reports of flatus  Vital signs in last 24 hours: [min-max] current  Temp:  [97.5 F (36.4 C)-98.3 F (36.8 C)] 97.5 F (36.4 C) (11/04 0642) Pulse Rate:  [82-109] 82 (11/04 0642) Resp:  [13-23] 16 (11/04 0642) BP: (106-141)/(63-86) 141/86 (11/04 0642) SpO2:  [94 %-100 %] 97 % (11/04 0642)       Weight: 104.3 kg BMI (Calculated): 34.97   Intake/Output last 2 shifts:  11/03 0701 - 11/04 0700 In: 2500 [I.V.:2100; IV Piggyback:400] Out: 2620 [Urine:2420; Blood:200]   Physical Exam:  Constitutional: alert, cooperative and no distress  Respiratory: breathing non-labored at rest  Cardiovascular: regular rate and sinus rhythm  Gastrointestinal: Soft, incisional soreness, non-distended, no rebound/guarding Integumentary: Laparoscopic incisions are CDI with staples and honeycomb dressing, no erythema, no drainage   Labs:  CBC Latest Ref Rng & Units 07/17/2021 06/11/2021 10/22/2020  WBC 4.0 - 10.5 K/uL 13.4(H) 4.5 3.6(L)  Hemoglobin 13.0 - 17.0 g/dL 12.6(L) 14.3 14.0  Hematocrit 39.0 - 52.0 % 36.4(L) 41.3 41.5  Platelets 150 - 400 K/uL 191 223 216.0   CMP Latest Ref Rng & Units 07/17/2021 06/11/2021 06/11/2021  Glucose 70 - 99 mg/dL - - 101(H)  BUN 6 - 20 mg/dL - - 17  Creatinine 0.61 - 1.24 mg/dL 1.87(H) 1.50(H) 1.55(H)  Sodium 135 - 145 mmol/L - - 138  Potassium 3.5 - 5.1 mmol/L - - 3.9  Chloride 98 - 111 mmol/L - - 102  CO2 22 - 32 mmol/L - - 25  Calcium 8.9 - 10.3  mg/dL - - 9.3  Total Protein 6.5 - 8.1 g/dL - - 7.6  Total Bilirubin 0.3 - 1.2 mg/dL - - 0.8  Alkaline Phos 38 - 126 U/L - - 78  AST 15 - 41 U/L - - 30  ALT 0 - 44 U/L - - 25    Imaging studies: No new pertinent imaging studies   Assessment/Plan:  50 y.o. male with AKI otherwise doing well 1 Day Post-Op s/p robotic assisted laparoscopic left colectomy for left colon polyp with high grade dysplasia.   - Continue full liquids for now as he has not eaten since surgery. If tolerates this morning/afternoon, we can consider advancement tonight - Continue IVF support given AKI; monitor UO - Okay to discontinue foley catheter - Complete perioperative Abx   - Monitor abdominal examination; on-going bowel fucntion - Pain control prn; antiemetics prn - Repeat CBC/BMP this morning   - Mobilization as tolerated    - Discharge Planning: anticipate home in next 24-48 hours pending clinical condition    All of the above findings and recommendations were discussed with the patient, and the medical team, and all of patient's questions were answered to his expressed satisfaction.  -- Edison Simon, PA-C [facility] 07/18/2021, 7:54 AM [phone] M-F: 7am - 4pm

## 2021-07-18 NOTE — Anesthesia Postprocedure Evaluation (Signed)
Anesthesia Post Note  Patient: Keith Mills  Procedure(s) Performed: XI ROBOT ASSISTED SIGMOID COLECTOMY, left with Edison Simon, PA-C to assist (Left) INDOCYANINE GREEN FLUORESCENCE IMAGING (ICG)  Patient location during evaluation: PACU Anesthesia Type: General Level of consciousness: awake and alert Pain management: pain level controlled Vital Signs Assessment: post-procedure vital signs reviewed and stable Respiratory status: spontaneous breathing, nonlabored ventilation, respiratory function stable and patient connected to nasal cannula oxygen Cardiovascular status: blood pressure returned to baseline and stable Postop Assessment: no apparent nausea or vomiting Anesthetic complications: no   No notable events documented.   Last Vitals:  Vitals:   07/18/21 0759 07/18/21 1510  BP: 120/80 121/81  Pulse: 80 77  Resp: 15 16  Temp: 36.8 C 36.7 C  SpO2: 97% 98%    Last Pain:  Vitals:   07/18/21 1510  TempSrc: Oral  PainSc:                  Martha Clan

## 2021-07-19 LAB — BASIC METABOLIC PANEL
Anion gap: 5 (ref 5–15)
BUN: 12 mg/dL (ref 6–20)
CO2: 26 mmol/L (ref 22–32)
Calcium: 8.3 mg/dL — ABNORMAL LOW (ref 8.9–10.3)
Chloride: 107 mmol/L (ref 98–111)
Creatinine, Ser: 1.36 mg/dL — ABNORMAL HIGH (ref 0.61–1.24)
GFR, Estimated: >60 mL/min (ref >60)
Glucose, Bld: 102 mg/dL — ABNORMAL HIGH (ref 70–99)
Potassium: 3.9 mmol/L (ref 3.5–5.1)
Sodium: 138 mmol/L (ref 135–145)

## 2021-07-19 LAB — CBC
HCT: 34.4 % — ABNORMAL LOW (ref 39.0–52.0)
Hemoglobin: 11.5 g/dL — ABNORMAL LOW (ref 13.0–17.0)
MCH: 30.4 pg (ref 26.0–34.0)
MCHC: 33.4 g/dL (ref 30.0–36.0)
MCV: 91 fL (ref 80.0–100.0)
Platelets: 173 10*3/uL (ref 150–400)
RBC: 3.78 MIL/uL — ABNORMAL LOW (ref 4.22–5.81)
RDW: 12.8 % (ref 11.5–15.5)
WBC: 7.1 10*3/uL (ref 4.0–10.5)
nRBC: 0 % (ref 0.0–0.2)

## 2021-07-19 MED ORDER — KETOROLAC TROMETHAMINE 15 MG/ML IJ SOLN
15.0000 mg | Freq: Four times a day (QID) | INTRAMUSCULAR | Status: DC
  Administered 2021-07-19 – 2021-07-20 (×5): 15 mg via INTRAVENOUS
  Filled 2021-07-19 (×5): qty 1

## 2021-07-19 MED ORDER — CYCLOBENZAPRINE HCL 10 MG PO TABS
5.0000 mg | ORAL_TABLET | Freq: Three times a day (TID) | ORAL | Status: DC
Start: 2021-07-19 — End: 2021-07-20
  Administered 2021-07-19 – 2021-07-20 (×3): 5 mg via ORAL
  Filled 2021-07-19 (×3): qty 1

## 2021-07-19 NOTE — Progress Notes (Signed)
POD # 2 Doing well + flatus Tolerating fulls Ambulated Pain still significant AVSS Creat back to baselin d/w him and he was unaware of chronic renal insufficiency, we will do renla consult as outpt Good UO  PE NAD Abd: soft, appropriate incisional tenderness w/o peritonitis  A/P Doing well Mobilize Heplock ivf May do soft diet for supper Added low dose toradol and flexeril Check bmp in am Anticipate DC in am

## 2021-07-19 NOTE — Plan of Care (Signed)
Continuing with plan of care. 

## 2021-07-20 MED ORDER — OXYCODONE-ACETAMINOPHEN 5-325 MG PO TABS: 1.0000 | ORAL_TABLET | ORAL | 0 refills | Status: DC | PRN

## 2021-07-20 MED ORDER — PANTOPRAZOLE SODIUM 40 MG PO TBEC: 40.0000 mg | DELAYED_RELEASE_TABLET | Freq: Every day | ORAL | Status: DC

## 2021-07-20 NOTE — Progress Notes (Signed)
PHARMACIST - PHYSICIAN COMMUNICATION  CONCERNING: IV to Oral Route Change Policy  RECOMMENDATION: This patient is receiving pantoprazole by the intravenous route.  Based on criteria approved by the Pharmacy and Therapeutics Committee, the intravenous medication(s) is/are being converted to the equivalent oral dose form(s).   DESCRIPTION: These criteria include: The patient is eating (either orally or via tube) and/or has been taking other orally administered medications for a least 24 hours The patient has no evidence of active gastrointestinal bleeding or impaired GI absorption (gastrectomy, short bowel, patient on TNA or NPO).  If you have questions about this conversion, please contact the [place], [facility] 07/20/2021 12:48 PM

## 2021-07-20 NOTE — Discharge Summary (Signed)
Patient ID: Keith Mills MRN: [id number] DOB/AGE: 01-29-1971 50 y.o.  Admit date: 07/17/2021 Discharge date: 07/20/2021   Discharge Diagnoses:  Active Problems:   S/P partial colectomy   S/P laparoscopic colectomy   Procedures:Robotic left colectomy  Hospital Course:  50 year old male admitted for left colectomy for high-grade dysplasia polyp.  Had uneventful postoperative course.  He Was kept for 3 nights. Diet was advanced slowly, he did have transient bump in creatinine and pt has chronic kidney ins. Creat went back to baseline. At  The time of discharge the patient was ambulating,  pain was controlled.  His vital signs were stable and he was afebrile.  He was having BM and tolerating soft diet. physical exam at discharge showed a pt  in no acute distress.  Awake and alert.  Abdomen: Soft incisions healing well without infection or peritonitis.  Extremities well-perfused and no edema.  Condition of the patient the time of discharge was stable    Disposition: Discharge disposition: 01-Home or Self Care       Discharge Instructions     Call MD for:  difficulty breathing, headache or visual disturbances   Complete by: As directed    Call MD for:  extreme fatigue   Complete by: As directed    Call MD for:  hives   Complete by: As directed    Call MD for:  persistant dizziness or light-headedness   Complete by: As directed    Call MD for:  persistant nausea and vomiting   Complete by: As directed    Call MD for:  redness, tenderness, or signs of infection (pain, swelling, redness, odor or green/yellow discharge around incision site)   Complete by: As directed    Call MD for:  severe uncontrolled pain   Complete by: As directed    Call MD for:  temperature >100.4   Complete by: As directed    Diet - low sodium heart healthy   Complete by: As directed    Discharge instructions   Complete by: As directed    SHower daily, may cover incisions prn   Increase activity slowly    Complete by: As directed    Lifting restrictions   Complete by: As directed    20 lbs x 6 wks      Allergies as of 07/20/2021   No Known Allergies      Medication List     TAKE these medications    amLODipine 5 MG tablet Commonly known as: NORVASC Take 1 tablet (5 mg total) by mouth daily for blood pressure. (Take 1 tablet (5 mg total) by mouth daily. For blood pressure.)   bisacodyl 5 MG EC tablet Commonly known as: DULCOLAX Take all 4 tablets at 8 am the morning prior to your surgery.   metroNIDAZOLE 500 MG tablet Commonly known as: FLAGYL Take 2 tablets at 8 AM, take 2 tablets at 2 PM, and take 2 tablets at 8 PM the day prior to your surgery.   MULTIVITAMIN ADULT PO Take by mouth.   neomycin 500 MG tablet Commonly known as: MYCIFRADIN Take 2 tablets (1,000 mg total) by mouth 3 (three) times daily.   oxyCODONE-acetaminophen 5-325 MG tablet Commonly known as: Percocet Take 1-2 tablets by mouth every 4 (four) hours as needed for severe pain.   polyethylene glycol powder 17 GM/SCOOP powder Commonly known as: MiraLax Mix full container in 64 ounces of Gatorade or other clear liquid. NO Red   tamsulosin 0.4 MG Caps capsule  Commonly known as: FLOMAX TAKE 1 CAPSULE BY MOUTH ONCE DAILY FOR URINARY STREAM        Follow-up Information     Brenyn Petrey, [place] F, MD Follow up in 2 week(s).   Specialty: General Surgery Contact information: [address] [place] [address] [phone]         Magnus Sinning, MD. Schedule an appointment as soon as possible for a visit in 3 week(s).   Specialty: Internal Medicine Contact information: [place] [address] [phone]                  Caroleen Hamman, MD FACS

## 2021-07-20 NOTE — Discharge Instructions (Signed)
Minimally Invasive  Colectomy Minimally invasive  colectomy is surgery to remove the large intestine, also called the colon, leaving the small intestine and the rectum. This procedure uses several small incisions in the abdomen instead of one large incision. This procedure is also called laparoscopic abdominal colectomy. Sometimes a robot can be used to control the tools used in the surgery. In such a case, the procedure is called robot-assisted laparoscopic surgery. This procedure may be used to treat several conditions that affect the colon, including: Inflammation (diverticulitis). Tumors or masses. Inflammatory bowel disease, such as Crohn's disease or ulcerative colitis. Bleeding. Blockage or obstruction. Tell a health care provider about: Any allergies you have. All medicines you are taking, including vitamins, herbs, eye drops, creams, and over-the-counter medicines. Any problems you or family members have had with anesthetic medicines. Any blood disorders you have. Any surgeries you have had. Any medical conditions you have. Whether you are pregnant or may be pregnant. What are the risks? Generally, this is a safe procedure. However, problems may occur, including: Infection. Bleeding. Allergic reactions to medicines or dyes. Damage to nearby structures or organs. Leaking from where the small intestine and rectum were sewn together, if this applies. Future blockage of the small intestines from scar tissue. Another surgery may be needed to repair this. Needing to convert to an open procedure. What happens before the procedure? Staying hydrated Follow instructions from your health care provider about hydration, which may include: Up to 2 hours before the procedure - you may continue to drink clear liquids, such as water, clear fruit juice, black coffee, and plain tea.   Eating and drinking restrictions Follow instructions from your health care provider about eating and drinking,  which may include: 8 hours before the procedure - stop eating meals with high fiber, and heavy meals or foods, such as meat, fried foods, or fatty foods. 6 hours before the procedure - stop eating light meals or foods, such as toast or cereal. 6 hours before the procedure - stop drinking milk or drinks that contain milk. 2 hours before the procedure - stop drinking clear liquids. Medicines Ask your health care provider about: Changing or stopping your regular medicines. This is especially important if you are taking diabetes medicines or blood thinners. Taking medicines such as aspirin and ibuprofen. These medicines can thin your blood. Do not take these medicines unless your health care provider tells you to take them. Taking over-the-counter medicines, vitamins, herbs, and supplements. Surgery safety Ask your health care provider: How your surgery site will be marked. What steps will be taken to help prevent infection. These may include: Removing hair at the surgery site. Washing skin with a germ-killing soap. Taking antibiotic medicine. You may be given antibiotic medicine to clean out bacteria from your colon. Follow the directions carefully and take the medicine at the correct time. General instructions Do not use any products that contain nicotine or tobacco for at least 4 weeks before the procedure. These products include cigarettes, e-cigarettes, and chewing tobacco. If you need help quitting, ask your health care provider. Plan to have someone take you home from the hospital or clinic. You may be prescribed an oral bowel prep to clean out your colon in preparation for the surgery. Follow instructions from your health care provider about how to do this. Do not eat or drink anything else after you have started the bowel prep, unless your health care provider tells you it is safe to do so. What happens during  the procedure? An IV will be inserted into one of your veins. You will be  given one or both of the following: A medicine to help you relax (sedative). A medicine to make you fall asleep (general anesthetic). Small monitors will be connected to your body. They will be used to check your heart, blood pressure, and oxygen level. A breathing tube may be placed into your lungs. A small, thin tube (catheter) will be placed into your bladder to drain urine. A tube may be placed through your nose and into your stomach to drain stomach fluids (nasogastric tube, or NG tube). Your abdomen will be filled with gas so it expands. This gives the surgeon more room to operate and makes your organs easier to see. Several small incisions will be made in your abdomen. A thin tube with a light and camera (laparoscope) will be inserted into one of the incisions. The camera on the laparoscope will send a picture to a computer screen in the operating room. This will give the surgeon a good view inside your abdomen. Hollow tubes will be inserted into the other small incisions in your abdomen. The tools that are needed for the procedure will be put through these tubes. A robot may also be used to control the instruments. Clamps or staples will be put on the end of the small intestine and the end of the colon, at the rectum. The part of the intestine between the clamps or staples will be removed. The end of the small intestine will be stitched (sutured) or stapled to the top of the rectum to allow your body to pass waste (stool). If the remaining intestines cannot be stitched back together, you will have a procedure called an ileostomy. This creates a new opening for stool to leave your body. If you need an ileostomy: An opening to the outside of your body (stoma) will be made through your abdomen. The end of your small intestine will be brought to the opening. It will be sutured to the skin. A removable bag (ostomy pouch) will be attached to the opening. Stool will drain into this bag. The rectum  will stay in place and will be closed with sutures or staples. The stoma may be temporary or permanent. The incisions from the colectomy will be closed with sutures or staples. The procedure may vary among health care providers and hospitals. What happens after the procedure? Your blood pressure, heart rate, breathing rate, and blood oxygen level will be monitored until you leave the hospital or clinic. You will receive fluids through an IV until your bowels start to work properly. Once your bowels are working again, you will be given clear liquids first and then solid food as tolerated. You will be given medicines to control your pain and nausea, if needed. Summary Minimally invasive total colectomy is surgery to remove all of the large intestine, or colon, from the abdomen. Tell your health care provider about all medical conditions you have and all medicines you are taking for those conditions. Follow all instructions before the procedure, including when to stop eating and drinking, and whether to change or stop any medicines. The colon will be removed using small incisions. A robot is sometimes used to do the surgery. Your surgeon will explain to you if the small intestine will be connected to the rectum, or if you will have a stoma to help you pass waste. This information is not intended to replace advice given to you by your health  care provider. Make sure you discuss any questions you have with your health care provider. Document Revised: 08/09/2019 Document Reviewed: 08/09/2019

## 2021-07-20 NOTE — Plan of Care (Signed)
Discharge teaching completed with patient who is in stable condition. 

## 2021-07-20 NOTE — Plan of Care (Signed)
Continuing with plan of care. 

## 2021-07-21 ENCOUNTER — Other Ambulatory Visit: Payer: Self-pay | Admitting: *Deleted

## 2021-07-21 NOTE — Patient Outreach (Signed)
[facility]) Care Management  07/21/2021  Keith Mills 03-25-1971 [id number]   Transition of care telephone call  Referral received:07/17/21 Initial outreach:07/21/21 Insurance: [facility]   Initial unsuccessful telephone call to patient's preferred number in order to complete transition of care assessment; no answer, left HIPAA compliant voicemail message requesting return call.   Objective: Per the electronic medical record, Keith Mills   was hospitalized at [facility] 11/3-11/6/22 for Robotic left colectomy  . Comorbidities include: Hypertension, hx of high grade dysphasia polyp.  He was discharged to home on 07/20/21 without the need for home health services or durable medical equipment per the discharge summary.   Plan: This RNCM will route unsuccessful outreach letter with [place] Management pamphlet and 24 hour Nurse Advice Line Magnet to [place] Management clinical pool to be mailed to patient's home address. This RNCM will attempt another outreach within 4 business days.   Joylene Draft, RN, BSN  [place] Management Coordinator  [phone]- Mobile [phone]- Toll Free Main Office

## 2021-07-22 LAB — SURGICAL PATHOLOGY

## 2021-07-23 ENCOUNTER — Telehealth: Payer: Self-pay

## 2021-07-23 NOTE — Telephone Encounter (Signed)
Spoke with patient's wife-results of pathology provided and post operative appointment 07/28/21 with Dr.Pabon.

## 2021-07-23 NOTE — Telephone Encounter (Signed)
Unable to leave message-mail box full----Per Dr.Pabon lab results from Colectomy is negative for malignancy-keep scheduled post operative appointment.

## 2021-07-24 ENCOUNTER — Encounter: Payer: Self-pay | Admitting: *Deleted

## 2021-07-24 ENCOUNTER — Other Ambulatory Visit: Payer: Self-pay | Admitting: *Deleted

## 2021-07-24 NOTE — Patient Outreach (Signed)
[facility]) Care Management  07/24/2021  BRENNON OTTERNESS Mar 01, 1971 [id number]   Transition of care call/case closure   Referral received:07/17/21 Initial outreach:07/21/21 Insurance: [facility]    2nd Call Attempt  Subjective: Initial successful telephone call to patient's preferred number in order to complete transition of care assessment; 2 HIPAA identifiers verified. Explained purpose of call and completed transition of care assessment.  Lister states that he I doing  just fine, denies post-operative problems, says surgical incisions are unremarkable, states surgical pain well managed with prescribed medications, tolerating diet, denies bowel or bladder problems.  Spouse is assisting with his recovery. Patient states he is  moving around great.  He reports being relieved with biopsy results.  Objective:   Mr. Kennth Vanbenschoten   was hospitalized at [facility] 11/3-11/6/22 for Robotic left colectomy  . Comorbidities include: Hypertension, hx of high grade dysphasia polyp.  He was discharged to home on 07/20/21 without the need for home health services or durable medical equipment per the discharge summary.  Assessment:  Patient voices good understanding of all discharge instructions.  See transition of care flowsheet for assessment details.   Plan:  Reviewed hospital discharge diagnosis of Robotic left colectomy  and discharge treatment plan using hospital discharge instructions, assessing medication adherence, reviewing problems requiring provider notification, and discussing the importance of follow up with surgeon, primary care provider and/or specialists as directed.   No ongoing care management needs identified so will close case to [place] Management services and route successful outreach letter with [place] Management pamphlet and 24 Hour Nurse Line Magnet to [place] Management  clinical pool to be mailed to patient's home address.   Joylene Draft, RN, BSN  [place] Management Coordinator  [phone]- Mobile [phone]- Toll Free Main Office

## 2021-07-25 ENCOUNTER — Other Ambulatory Visit (HOSPITAL_COMMUNITY): Payer: Self-pay

## 2021-07-28 ENCOUNTER — Ambulatory Visit (INDEPENDENT_AMBULATORY_CARE_PROVIDER_SITE_OTHER): Payer: 59 | Admitting: Surgery

## 2021-07-28 ENCOUNTER — Encounter: Payer: Self-pay | Admitting: Surgery

## 2021-07-28 ENCOUNTER — Other Ambulatory Visit: Payer: Self-pay

## 2021-07-28 VITALS — BP 144/92 | HR 71 | Temp 97.8°F | Ht 68.0 in | Wt 222.6 lb

## 2021-07-28 DIAGNOSIS — Z09 Encounter for follow-up examination after completed treatment for conditions other than malignant neoplasm: Secondary | ICD-10-CM

## 2021-07-28 DIAGNOSIS — N289 Disorder of kidney and ureter, unspecified: Secondary | ICD-10-CM

## 2021-07-28 DIAGNOSIS — D126 Benign neoplasm of colon, unspecified: Secondary | ICD-10-CM

## 2021-07-28 NOTE — Patient Instructions (Addendum)
Referral to Dr.Hameet Candiss Norse has been sent. Someone from their office will call to schedule the appointment. If you do not hear from anyone within 5 days please call their office.    Please cal office if you have questions or concerns.We remove the staples today and placed steri strips. These will begin to fall off within 10-12 days. You may shower as usual but do not rub over the areas. Be sure to pat the strips dry. You may ride the bike and treadmill.     GENERAL POST-OPERATIVE  PATIENT INSTRUCTIONS   WOUND CARE INSTRUCTIONS:  Keep a dry clean dressing on the wound if there is drainage. The initial bandage may be removed after 24 hours.  Once the wound has quit draining you may leave it open to air.  If clothing rubs against the wound or causes irritation and the wound is not draining you may cover it with a dry dressing during the daytime.  Try to keep the wound dry and avoid ointments on the wound unless directed to do so.  If the wound becomes bright red and painful or starts to drain infected material that is not clear, please contact your physician immediately.  If the wound is mildly pink and has a thick firm ridge underneath it, this is normal, and is referred to as a healing ridge.  This will resolve over the next 4-6 weeks.  BATHING: You may shower if you have been informed of this by your surgeon. However, Please do not submerge in a tub, hot tub, or pool until incisions are completely sealed or have been told by your surgeon that you may do so.  DIET:  You may eat any foods that you can tolerate.  It is a good idea to eat a high fiber diet and take in plenty of fluids to prevent constipation.  If you do become constipated you may want to take a mild laxative or take ducolax tablets on a daily basis until your bowel habits are regular.  Constipation can be very uncomfortable, along with straining, after recent surgery.  ACTIVITY:  You are encouraged to cough and deep breath or use your  incentive spirometer if you were given one, every 15-30 minutes when awake.  This will help prevent respiratory complications and low grade fevers post-operatively if you had a general anesthetic.  You may want to hug a pillow when coughing and sneezing to add additional support to the surgical area, if you had abdominal or chest surgery, which will decrease pain during these times.  You are encouraged to walk and engage in light activity for the next two weeks.  You should not lift more than 20 pounds, until 09/04/2021 as it could put you at increased risk for complications.  Twenty pounds is roughly equivalent to a plastic bag of groceries. At that time- Listen to your body when lifting, if you have pain when lifting, stop and then try again in a few days. Soreness after doing exercises or activities of daily living is normal as you get back in to your normal routine.  MEDICATIONS:  Try to take narcotic medications and anti-inflammatory medications, such as tylenol, ibuprofen, naprosyn, etc., with food.  This will minimize stomach upset from the medication.  Should you develop nausea and vomiting from the pain medication, or develop a rash, please discontinue the medication and contact your physician.  You should not drive, make important decisions, or operate machinery when taking narcotic pain medication.  SUNBLOCK Use sun  block to incision area over the next year if this area will be exposed to sun. This helps decrease scarring and will allow you avoid a permanent darkened area over your incision.  QUESTIONS:  Please feel free to call our office if you have any questions, and we will be glad to assist you. [phone]

## 2021-07-29 NOTE — Progress Notes (Signed)
Tailor is 11 days out after robotic left colectomy.  Pathology did not show any cancer.  He is doing very well.  No fevers no chills.  he is taking p.o. and having bowel movement  PE NAD Abd: soft, incisions healing well without evidence of infections.  I removed all the staples and place Steri-Strips.  No peritonitis  A/P doing very well without any serous complications.  May slowly resume activities. We will see him back in a couple months.

## 2021-07-30 ENCOUNTER — Other Ambulatory Visit (HOSPITAL_COMMUNITY): Payer: Self-pay

## 2021-08-12 ENCOUNTER — Telehealth: Payer: Self-pay | Admitting: *Deleted

## 2021-08-12 DIAGNOSIS — R9431 Abnormal electrocardiogram [ECG] [EKG]: Secondary | ICD-10-CM | POA: Diagnosis not present

## 2021-08-12 DIAGNOSIS — K56609 Unspecified intestinal obstruction, unspecified as to partial versus complete obstruction: Secondary | ICD-10-CM | POA: Diagnosis not present

## 2021-08-12 DIAGNOSIS — N400 Enlarged prostate without lower urinary tract symptoms: Secondary | ICD-10-CM | POA: Diagnosis not present

## 2021-08-12 DIAGNOSIS — K913 Postprocedural intestinal obstruction, unspecified as to partial versus complete: Secondary | ICD-10-CM | POA: Diagnosis not present

## 2021-08-12 DIAGNOSIS — I10 Essential (primary) hypertension: Secondary | ICD-10-CM | POA: Diagnosis not present

## 2021-08-12 DIAGNOSIS — K5900 Constipation, unspecified: Secondary | ICD-10-CM | POA: Diagnosis not present

## 2021-08-12 DIAGNOSIS — R110 Nausea: Secondary | ICD-10-CM | POA: Diagnosis not present

## 2021-08-12 DIAGNOSIS — K5669 Other partial intestinal obstruction: Secondary | ICD-10-CM | POA: Diagnosis not present

## 2021-08-12 DIAGNOSIS — K9131 Postprocedural partial intestinal obstruction: Secondary | ICD-10-CM | POA: Diagnosis not present

## 2021-08-12 DIAGNOSIS — Z20822 Contact with and (suspected) exposure to covid-19: Secondary | ICD-10-CM | POA: Diagnosis not present

## 2021-08-12 DIAGNOSIS — Z452 Encounter for adjustment and management of vascular access device: Secondary | ICD-10-CM | POA: Diagnosis not present

## 2021-08-12 DIAGNOSIS — R1032 Left lower quadrant pain: Secondary | ICD-10-CM | POA: Diagnosis not present

## 2021-08-12 DIAGNOSIS — Z9049 Acquired absence of other specified parts of digestive tract: Secondary | ICD-10-CM | POA: Diagnosis not present

## 2021-08-12 DIAGNOSIS — Z4682 Encounter for fitting and adjustment of non-vascular catheter: Secondary | ICD-10-CM | POA: Diagnosis not present

## 2021-08-12 NOTE — Telephone Encounter (Signed)
Spoke with the patient and he states that he ate a salad yesterday and this was the first time eating something like this since surgery. He states that he had a bowel movement today. No other symptoms except the discomfort that does get worse when he presses on his left side. I let him know he did not have any concerning symptoms and that we could schedule him to be seen in office if the discomfort persisted. He declined this and said he will go to the ER to be seen instead.

## 2021-08-12 NOTE — Telephone Encounter (Signed)
Patient called and stated that he had surgery on 07/17/21 by Dr Dahlia Byes Sigmoid Colectomy and since last night he has had constant left side discomfort on his left side more towards the middle. He took Pepto and imodium. He denies fever, chill, nausea and vomiting. Patient wants to know is this expected or should he be worried. Please call and advise

## 2021-08-13 ENCOUNTER — Inpatient Hospital Stay: Admission: AD | Admit: 2021-08-13 | Payer: 59 | Source: Other Acute Inpatient Hospital | Admitting: Surgery

## 2021-08-14 ENCOUNTER — Other Ambulatory Visit (HOSPITAL_COMMUNITY): Payer: Self-pay

## 2021-08-15 ENCOUNTER — Other Ambulatory Visit: Payer: Self-pay

## 2021-08-15 ENCOUNTER — Encounter: Payer: Self-pay | Admitting: Surgery

## 2021-08-15 ENCOUNTER — Ambulatory Visit (INDEPENDENT_AMBULATORY_CARE_PROVIDER_SITE_OTHER): Payer: 59 | Admitting: Surgery

## 2021-08-15 ENCOUNTER — Telehealth: Payer: Self-pay

## 2021-08-15 VITALS — Ht 68.0 in | Wt 217.0 lb

## 2021-08-15 DIAGNOSIS — Z09 Encounter for follow-up examination after completed treatment for conditions other than malignant neoplasm: Secondary | ICD-10-CM

## 2021-08-15 DIAGNOSIS — R112 Nausea with vomiting, unspecified: Secondary | ICD-10-CM

## 2021-08-15 DIAGNOSIS — K6389 Other specified diseases of intestine: Secondary | ICD-10-CM

## 2021-08-15 MED ORDER — ONDANSETRON HCL 4 MG PO TABS
4.0000 mg | ORAL_TABLET | Freq: Three times a day (TID) | ORAL | 0 refills | Status: AC | PRN
  Filled 2021-08-15: qty 20, 7d supply, fill #0

## 2021-08-15 NOTE — Patient Instructions (Addendum)
We want you to get fluids in today. You should use Pedialyte and water to prevent dehydration. If you cannot tolerate this or continue to vomit we will need to admit you to the hospital for fluids.  Call us at 2:30 pm today with an update.   We will sent you in a prescription for Zofran for nausea. Take this 15-20 minutes before you start to drink liquids.   We will get you scheduled for a CT scan for Monday morning. We will have you follow up here that afternoon.   You are scheduled for a CT scan at [facility] on Monday December 5th. You will need to arrive at the [place] entrance at 7:45 am. You will need to have nothing to eat for 4 hours prior. You will need to pick up a prep kit for this scan.

## 2021-08-15 NOTE — Progress Notes (Signed)
Keith Mills is 1 month out after robotic left colectomy. He did well initially until about 3 days ago where he started having abdominal pain.  He went to [facility] and was admitted for 1 day secondary to small bowel obstruction.   He started having some pain 3 days ago and some nausea. Report suggest low-grade obstruction with some inflammatory response around the anastomosis and there were question of fistula.  There was no evidence of free air.I did actually pers reviewed the images. I do not think is a fistula , no abscess, no free air to suggest a free perforation. He was seen by Dr. Megan Salon who is at [place] and I was in communications with her.  He was able to tolerate clamping trial and was sent home on antibiotics yesterday. He is passing flatus but did have an episode of emesis. No fevers or chills, he is making good urine    PE NAD alert Abd: soft, incisions healing well. Minimal soreness, no peritonitis, no infection , no rebound, Non distended   A/P Partial SBO This is likely from inflammatory reaction around anastomosis creating reactive reaction around the SB.  I do not believe that this is a fistula. I would  Like to repeat a CT scan of the abdomen pelvis Monday to assess for potential abscess and make sure there is no complications related to the anastomosis, abscess of SBO. He is currently not toxic any emergent surgical intervention. Instructed to take Pedialyte and slowly progress his diet. If he does not tolerate liquids I told him to call me so we can direct admit for IV hydration and management of partial SBO.Patient to continue antibiotics. I also told him to stop imodium that he was taking on a prn basis. We will follow him up closely

## 2021-08-15 NOTE — Telephone Encounter (Signed)
Patient states he is feeling better -nausea medication is helping-he is keeping fluids down-he denies fever. His pain is level is 1. He has had a bowel movement.  He was instructed to go to the emergency room if he worsens over the weekend.

## 2021-08-18 ENCOUNTER — Encounter: Payer: Self-pay | Admitting: Surgery

## 2021-08-18 ENCOUNTER — Ambulatory Visit
Admission: RE | Admit: 2021-08-18 | Discharge: 2021-08-18 | Disposition: A | Discharge status: Home / Self Care | Payer: 59 | Source: Ambulatory Visit | Attending: Surgery | Admitting: Surgery

## 2021-08-18 ENCOUNTER — Telehealth: Payer: Self-pay | Admitting: Surgery

## 2021-08-18 ENCOUNTER — Ambulatory Visit (INDEPENDENT_AMBULATORY_CARE_PROVIDER_SITE_OTHER): Payer: 59 | Admitting: Surgery

## 2021-08-18 ENCOUNTER — Other Ambulatory Visit: Payer: Self-pay

## 2021-08-18 VITALS — BP 121/85 | HR 97 | Temp 97.6°F | Ht 68.0 in | Wt 214.8 lb

## 2021-08-18 DIAGNOSIS — R109 Unspecified abdominal pain: Secondary | ICD-10-CM | POA: Diagnosis not present

## 2021-08-18 DIAGNOSIS — R1110 Vomiting, unspecified: Secondary | ICD-10-CM | POA: Diagnosis not present

## 2021-08-18 DIAGNOSIS — K6389 Other specified diseases of intestine: Secondary | ICD-10-CM | POA: Insufficient documentation

## 2021-08-18 DIAGNOSIS — R112 Nausea with vomiting, unspecified: Secondary | ICD-10-CM | POA: Diagnosis not present

## 2021-08-18 DIAGNOSIS — K565 Intestinal adhesions [bands], unspecified as to partial versus complete obstruction: Secondary | ICD-10-CM

## 2021-08-18 MED ORDER — IOHEXOL 300 MG/ML  SOLN
100.0000 mL | Freq: Once | INTRAMUSCULAR | Status: AC | PRN
  Administered 2021-08-18: 100 mL via INTRAVENOUS

## 2021-08-18 MED ORDER — SODIUM CHLORIDE 0.9 % IV SOLN
2.0000 g | INTRAVENOUS | Status: AC
  Administered 2021-08-19: 2 g via INTRAVENOUS

## 2021-08-18 MED ORDER — GABAPENTIN 300 MG PO CAPS: 300.0000 mg | ORAL_CAPSULE | ORAL | Status: AC

## 2021-08-18 MED ORDER — CHLORHEXIDINE GLUCONATE CLOTH 2 % EX PADS: 6.0000 | MEDICATED_PAD | Freq: Once | CUTANEOUS | Status: DC

## 2021-08-18 MED ORDER — ACETAMINOPHEN 500 MG PO TABS: 1000.0000 mg | ORAL_TABLET | ORAL | Status: AC

## 2021-08-18 NOTE — Progress Notes (Signed)
Outpatient Surgical Follow Up  08/18/2021  Keith Mills is an 50 y.o. male.   Chief Complaint  Patient presents with   Routine Post Op    Sigmoid colectomy     HPI: Keith Mills is a 5 weeks out after robotic left colectomy.  He did well and most recently 5 days ago went to [facility] for partial low-grade bowel obstruction.  He was managed with an NG tube and was able to pass that.  I saw him last Friday and he was doing better however he now reports that he vomited after he had a p.o. contrast today.  He has been passing some gas but no bowel movements.  He is only being on liquid diet. He does report persistent intermittent abdominal pain that is crampy.  No fevers no chills.  He did have a CT scan earlier today showing evidence of persistent bowel obstruction.  There is no evidence of an abscess there is no evidence of an anastomotic leak.  There is no evidence of free air. Transition point is next anastomosis and there is decompressed terminal ileum  Past Medical History:  Diagnosis Date   Hypertension     Past Surgical History:  Procedure Laterality Date   COLONOSCOPY N/A 04/08/2021   Procedure: COLONOSCOPY;  Surgeon: Jonathon Bellows, MD;  Location: [facility] ENDOSCOPY;  Service: Gastroenterology;  Laterality: N/A;    Family History  Problem Relation Age of Onset   Kidney failure Mother    Hypertension Mother    Breast cancer Mother    Prostate cancer Father 47   Heart disease Father    Hypertension Sister    Colon polyps Sister     Social History:  reports that he has never smoked. He has never used smokeless tobacco. He reports current alcohol use of about 1.0 - 2.0 standard drink per week. He reports that he does not use drugs.  Allergies: No Known Allergies  Medications reviewed.    ROS Full ROS performed and is otherwise negative other than what is stated in HPI   BP 121/85   Pulse 97   Temp 97.6 F (36.4 C) (Oral)   Ht 5\' 8"  (1.727 m)   Wt 214 lb 12.8 oz (97.4  kg)   SpO2 93%   BMI 32.66 kg/m   Physical Exam Vitals and nursing note reviewed. Exam conducted with a chaperone present.  Constitutional:      General: He is not in acute distress.    Appearance: Normal appearance. He is normal weight.  Cardiovascular:     Rate and Rhythm: Normal rate and regular rhythm.     Heart sounds: No murmur heard. Pulmonary:     Effort: Pulmonary effort is normal. No respiratory distress.     Breath sounds: Normal breath sounds. No stridor. No wheezing.  Abdominal:     Palpations: Abdomen is soft. There is no mass.     Tenderness: There is no right CVA tenderness, guarding or rebound.     Hernia: No hernia is present.     Comments: Mild Tenderness to palpation diffusely, no peritonitis  Musculoskeletal:     Cervical back: Normal range of motion and neck supple. No rigidity or tenderness.  Skin:    General: Skin is warm and dry.     Capillary Refill: Capillary refill takes less than 2 seconds.     Coloration: Skin is not jaundiced or pale.  Neurological:     General: No focal deficit present.  Mental Status: He is alert and oriented to person, place, and time.  Psychiatric:        Mood and Affect: Mood normal.        Behavior: Behavior normal.        Thought Content: Thought content normal.        Judgment: Judgment normal.       No results found for this or any previous visit (from the past 48 hour(s)). CT Abdomen Pelvis W Contrast  Result Date: 08/18/2021 CLINICAL DATA:  Nausea, vomiting, abdominal pain EXAM: CT ABDOMEN AND PELVIS WITH CONTRAST TECHNIQUE: Multidetector CT imaging of the abdomen and pelvis was performed using the standard protocol following bolus administration of intravenous contrast. CONTRAST:  129mL OMNIPAQUE IOHEXOL 300 MG/ML  SOLN COMPARISON:  06/11/2021 FINDINGS: Lower chest: Lung bases are clear. No effusions. Heart is normal size. Hepatobiliary: No focal hepatic abnormality. Gallbladder unremarkable. Pancreas: No focal  abnormality or ductal dilatation. Spleen: No focal abnormality.  Normal size. Adrenals/Urinary Tract: No adrenal abnormality. No focal renal abnormality. No stones or hydronephrosis. Urinary bladder is unremarkable. Stomach/Bowel: Dilated small bowel loops in the proximal to mid small bowel. Distal small bowel is decompressed. Transition point in the left mid abdomen, likely related to adhesions. Findings compatible with small bowel obstruction. Vascular/Lymphatic: No evidence of aneurysm or adenopathy. Reproductive: No visible focal abnormality. Other: Trace free fluid in the cul-de-sac.  No free air. Musculoskeletal: No acute bony abnormality. IMPRESSION: Dilated proximal to mid small bowel loops with transition to decompressed small bowel in the left mid abdomen compatible with small bowel obstruction. This is likely related to adhesions. Trace free fluid in the pelvis. Electronically Signed   By: Rolm Baptise M.D.   On: 08/18/2021 08:50    Assessment/Plan: Keith Mills is 5 weeks out from a robotic left colectomy now with postoperative bowel obstruction that has not resolved for the last 5 to 6 days.  Given the recurrence of systems and persistent images findings consistent with bowel obstruction I do think that surgical management is indicated.  We can definitely attempt to perform this robotically.  Currently he is not toxic he is not per genetic.  Patient was given the option of admission to the hospital tonight versus the patient coming in the morning.  He knows that there is a good chance that we may need to admit him depending on operative findings.  Procedure discussed with the patient in detail.  Risks, benefits and possible complications including but not limited to: Bleeding, infection injury to adjacent structures.  He understands and wished to proceed       Caroleen Hamman, MD [place] Surgeon

## 2021-08-18 NOTE — Telephone Encounter (Signed)
Patient has been advised of Pre-Admission date/time, COVID Testing date and Surgery date.  Surgery Date: 08/19/21 @ 12:30 Preadmission Testing Date: 08/19/21 @ 12:30 arriving in person since add on for next day.   Covid Testing Date: 08/19/21 @ 12:30 - patient advised to go to the [facility] ([place]) between 8a-1p

## 2021-08-18 NOTE — H&P (View-Only) (Signed)
Outpatient Surgical Follow Up  08/18/2021  Keith Mills is an 50 y.o. male.   Chief Complaint  Patient presents with   Routine Post Op    Sigmoid colectomy     HPI: Keith Mills is a 5 weeks out after robotic left colectomy.  He did well and most recently 5 days ago went to [facility] for partial low-grade bowel obstruction.  He was managed with an NG tube and was able to pass that.  I saw him last Friday and he was doing better however he now reports that he vomited after he had a p.o. contrast today.  He has been passing some gas but no bowel movements.  He is only being on liquid diet. He does report persistent intermittent abdominal pain that is crampy.  No fevers no chills.  He did have a CT scan earlier today showing evidence of persistent bowel obstruction.  There is no evidence of an abscess there is no evidence of an anastomotic leak.  There is no evidence of free air. Transition point is next anastomosis and there is decompressed terminal ileum  Past Medical History:  Diagnosis Date   Hypertension     Past Surgical History:  Procedure Laterality Date   COLONOSCOPY N/A 04/08/2021   Procedure: COLONOSCOPY;  Surgeon: Jonathon Bellows, MD;  Location: [facility] ENDOSCOPY;  Service: Gastroenterology;  Laterality: N/A;    Family History  Problem Relation Age of Onset   Kidney failure Mother    Hypertension Mother    Breast cancer Mother    Prostate cancer Father 3   Heart disease Father    Hypertension Sister    Colon polyps Sister     Social History:  reports that he has never smoked. He has never used smokeless tobacco. He reports current alcohol use of about 1.0 - 2.0 standard drink per week. He reports that he does not use drugs.  Allergies: No Known Allergies  Medications reviewed.    ROS Full ROS performed and is otherwise negative other than what is stated in HPI   BP 121/85   Pulse 97   Temp 97.6 F (36.4 C) (Oral)   Ht 5\' 8"  (1.727 m)   Wt 214 lb 12.8 oz (97.4  kg)   SpO2 93%   BMI 32.66 kg/m   Physical Exam Vitals and nursing note reviewed. Exam conducted with a chaperone present.  Constitutional:      General: He is not in acute distress.    Appearance: Normal appearance. He is normal weight.  Cardiovascular:     Rate and Rhythm: Normal rate and regular rhythm.     Heart sounds: No murmur heard. Pulmonary:     Effort: Pulmonary effort is normal. No respiratory distress.     Breath sounds: Normal breath sounds. No stridor. No wheezing.  Abdominal:     Palpations: Abdomen is soft. There is no mass.     Tenderness: There is no right CVA tenderness, guarding or rebound.     Hernia: No hernia is present.     Comments: Mild Tenderness to palpation diffusely, no peritonitis  Musculoskeletal:     Cervical back: Normal range of motion and neck supple. No rigidity or tenderness.  Skin:    General: Skin is warm and dry.     Capillary Refill: Capillary refill takes less than 2 seconds.     Coloration: Skin is not jaundiced or pale.  Neurological:     General: No focal deficit present.  Mental Status: He is alert and oriented to person, place, and time.  Psychiatric:        Mood and Affect: Mood normal.        Behavior: Behavior normal.        Thought Content: Thought content normal.        Judgment: Judgment normal.       No results found for this or any previous visit (from the past 48 hour(s)). CT Abdomen Pelvis W Contrast  Result Date: 08/18/2021 CLINICAL DATA:  Nausea, vomiting, abdominal pain EXAM: CT ABDOMEN AND PELVIS WITH CONTRAST TECHNIQUE: Multidetector CT imaging of the abdomen and pelvis was performed using the standard protocol following bolus administration of intravenous contrast. CONTRAST:  165mL OMNIPAQUE IOHEXOL 300 MG/ML  SOLN COMPARISON:  06/11/2021 FINDINGS: Lower chest: Lung bases are clear. No effusions. Heart is normal size. Hepatobiliary: No focal hepatic abnormality. Gallbladder unremarkable. Pancreas: No focal  abnormality or ductal dilatation. Spleen: No focal abnormality.  Normal size. Adrenals/Urinary Tract: No adrenal abnormality. No focal renal abnormality. No stones or hydronephrosis. Urinary bladder is unremarkable. Stomach/Bowel: Dilated small bowel loops in the proximal to mid small bowel. Distal small bowel is decompressed. Transition point in the left mid abdomen, likely related to adhesions. Findings compatible with small bowel obstruction. Vascular/Lymphatic: No evidence of aneurysm or adenopathy. Reproductive: No visible focal abnormality. Other: Trace free fluid in the cul-de-sac.  No free air. Musculoskeletal: No acute bony abnormality. IMPRESSION: Dilated proximal to mid small bowel loops with transition to decompressed small bowel in the left mid abdomen compatible with small bowel obstruction. This is likely related to adhesions. Trace free fluid in the pelvis. Electronically Signed   By: Rolm Baptise M.D.   On: 08/18/2021 08:50    Assessment/Plan: Keith Mills is 5 weeks out from a robotic left colectomy now with postoperative bowel obstruction that has not resolved for the last 5 to 6 days.  Given the recurrence of systems and persistent images findings consistent with bowel obstruction I do think that surgical management is indicated.  We can definitely attempt to perform this robotically.  Currently he is not toxic he is not per genetic.  Patient was given the option of admission to the hospital tonight versus the patient coming in the morning.  He knows that there is a good chance that we may need to admit him depending on operative findings.  Procedure discussed with the patient in detail.  Risks, benefits and possible complications including but not limited to: Bleeding, infection injury to adjacent structures.  He understands and wished to proceed       Caroleen Hamman, MD [place] Surgeon

## 2021-08-18 NOTE — Patient Instructions (Addendum)
Our surgery scheduler will call you within 24-48 hours to schedule your surgery.  Please do not eat/drink after midnight. Please arrive at same day surgery at 12:30 tomorrow 08/19/21.

## 2021-08-19 ENCOUNTER — Encounter
Admission: RE | Disposition: A | Discharge status: Home / Self Care | Payer: Self-pay | Source: Home / Self Care | Attending: Surgery

## 2021-08-19 ENCOUNTER — Encounter: Payer: Self-pay | Admitting: Surgery

## 2021-08-19 ENCOUNTER — Ambulatory Visit: Payer: 59 | Admitting: Registered Nurse

## 2021-08-19 ENCOUNTER — Observation Stay
Admission: RE | Admit: 2021-08-19 | Discharge: 2021-08-21 | Disposition: A | Discharge status: Home / Self Care | Payer: 59 | Attending: Surgery | Admitting: Surgery

## 2021-08-19 ENCOUNTER — Observation Stay: Payer: 59

## 2021-08-19 DIAGNOSIS — K567 Ileus, unspecified: Secondary | ICD-10-CM

## 2021-08-19 DIAGNOSIS — K56609 Unspecified intestinal obstruction, unspecified as to partial versus complete obstruction: Secondary | ICD-10-CM | POA: Diagnosis not present

## 2021-08-19 DIAGNOSIS — K565 Intestinal adhesions [bands], unspecified as to partial versus complete obstruction: Secondary | ICD-10-CM | POA: Diagnosis not present

## 2021-08-19 DIAGNOSIS — Z9049 Acquired absence of other specified parts of digestive tract: Secondary | ICD-10-CM | POA: Diagnosis not present

## 2021-08-19 DIAGNOSIS — Z4682 Encounter for fitting and adjustment of non-vascular catheter: Secondary | ICD-10-CM | POA: Diagnosis not present

## 2021-08-19 DIAGNOSIS — Z4659 Encounter for fitting and adjustment of other gastrointestinal appliance and device: Secondary | ICD-10-CM

## 2021-08-19 DIAGNOSIS — K9189 Other postprocedural complications and disorders of digestive system: Secondary | ICD-10-CM

## 2021-08-19 DIAGNOSIS — K56699 Other intestinal obstruction unspecified as to partial versus complete obstruction: Principal | ICD-10-CM | POA: Insufficient documentation

## 2021-08-19 DIAGNOSIS — I10 Essential (primary) hypertension: Secondary | ICD-10-CM | POA: Diagnosis not present

## 2021-08-19 HISTORY — DX: Unspecified intestinal obstruction, unspecified as to partial versus complete obstruction: K56.609

## 2021-08-19 LAB — CBC
HCT: 38.2 % — ABNORMAL LOW (ref 39.0–52.0)
Hemoglobin: 13.3 g/dL (ref 13.0–17.0)
MCH: 30.1 pg (ref 26.0–34.0)
MCHC: 34.8 g/dL (ref 30.0–36.0)
MCV: 86.4 fL (ref 80.0–100.0)
Platelets: 243 10*3/uL (ref 150–400)
RBC: 4.42 MIL/uL (ref 4.22–5.81)
RDW: 12.3 % (ref 11.5–15.5)
WBC: 16.1 10*3/uL — ABNORMAL HIGH (ref 4.0–10.5)
nRBC: 0 % (ref 0.0–0.2)

## 2021-08-19 LAB — CREATININE, SERUM
Creatinine, Ser: 2.98 mg/dL — ABNORMAL HIGH (ref 0.61–1.24)
GFR, Estimated: 25 mL/min — ABNORMAL LOW (ref >60)

## 2021-08-19 SURGERY — LAPAROSCOPY, DIAGNOSTIC, ROBOT-ASSISTED
Anesthesia: General

## 2021-08-19 MED ORDER — SUCCINYLCHOLINE CHLORIDE 200 MG/10ML IV SOSY
PREFILLED_SYRINGE | INTRAVENOUS | Status: AC
  Filled 2021-08-19: qty 10

## 2021-08-19 MED ORDER — MIDAZOLAM HCL 2 MG/2ML IJ SOLN
INTRAMUSCULAR | Status: AC
  Filled 2021-08-19: qty 2

## 2021-08-19 MED ORDER — FENTANYL CITRATE (PF) 100 MCG/2ML IJ SOLN
INTRAMUSCULAR | Status: DC | PRN
  Administered 2021-08-19 (×2): 50 ug via INTRAVENOUS

## 2021-08-19 MED ORDER — SODIUM CHLORIDE 0.9 % IV SOLN
2.0000 g | Freq: Two times a day (BID) | INTRAVENOUS | Status: AC
  Administered 2021-08-20: 2 g via INTRAVENOUS

## 2021-08-19 MED ORDER — PROCHLORPERAZINE MALEATE 10 MG PO TABS
10.0000 mg | ORAL_TABLET | Freq: Four times a day (QID) | ORAL | Status: DC | PRN
  Filled 2021-08-19: qty 1

## 2021-08-19 MED ORDER — PHENYLEPHRINE HCL (PRESSORS) 10 MG/ML IV SOLN
INTRAVENOUS | Status: DC | PRN
Start: 2021-08-19 — End: 2021-08-19
  Administered 2021-08-19 (×3): 80 ug via INTRAVENOUS

## 2021-08-19 MED ORDER — ENOXAPARIN SODIUM 60 MG/0.6ML IJ SOSY
0.5000 mg/kg | PREFILLED_SYRINGE | INTRAMUSCULAR | Status: DC
  Filled 2021-08-19: qty 0.47

## 2021-08-19 MED ORDER — SUGAMMADEX SODIUM 200 MG/2ML IV SOLN
INTRAVENOUS | Status: DC | PRN
  Administered 2021-08-19: 300 mg via INTRAVENOUS

## 2021-08-19 MED ORDER — BUPIVACAINE LIPOSOME 1.3 % IJ SUSP
INTRAMUSCULAR | Status: AC
  Filled 2021-08-19: qty 20

## 2021-08-19 MED ORDER — FENTANYL CITRATE (PF) 100 MCG/2ML IJ SOLN
INTRAMUSCULAR | Status: AC
  Filled 2021-08-19: qty 2

## 2021-08-19 MED ORDER — 0.9 % SODIUM CHLORIDE (POUR BTL) OPTIME
TOPICAL | Status: DC | PRN
  Administered 2021-08-19: 500 mL

## 2021-08-19 MED ORDER — MORPHINE SULFATE (PF) 2 MG/ML IV SOLN: 2.0000 mg | INTRAVENOUS | Status: DC | PRN

## 2021-08-19 MED ORDER — PROMETHAZINE HCL 25 MG/ML IJ SOLN: 6.2500 mg | INTRAMUSCULAR | Status: DC | PRN

## 2021-08-19 MED ORDER — CHLORHEXIDINE GLUCONATE 0.12 % MT SOLN: 15.0000 mL | Freq: Once | OROMUCOSAL | Status: DC

## 2021-08-19 MED ORDER — ACETAMINOPHEN 500 MG PO TABS
ORAL_TABLET | ORAL | Status: AC
  Administered 2021-08-19: 1000 mg via ORAL
  Filled 2021-08-19: qty 2

## 2021-08-19 MED ORDER — MORPHINE SULFATE (PF) 4 MG/ML IV SOLN
INTRAVENOUS | Status: AC
  Administered 2021-08-19: 2 mg
  Filled 2021-08-19: qty 1

## 2021-08-19 MED ORDER — BUPIVACAINE-EPINEPHRINE (PF) 0.25% -1:200000 IJ SOLN
INTRAMUSCULAR | Status: AC
  Filled 2021-08-19: qty 30

## 2021-08-19 MED ORDER — DEXMEDETOMIDINE (PRECEDEX) IN NS 20 MCG/5ML (4 MCG/ML) IV SYRINGE
PREFILLED_SYRINGE | INTRAVENOUS | Status: AC
  Filled 2021-08-19: qty 5

## 2021-08-19 MED ORDER — PANTOPRAZOLE SODIUM 40 MG IV SOLR
40.0000 mg | Freq: Every day | INTRAVENOUS | Status: DC
  Administered 2021-08-20: 40 mg via INTRAVENOUS
  Filled 2021-08-19 (×3): qty 40

## 2021-08-19 MED ORDER — BUPIVACAINE-EPINEPHRINE 0.25% -1:200000 IJ SOLN
INTRAMUSCULAR | Status: DC | PRN
  Administered 2021-08-19: 50 mL

## 2021-08-19 MED ORDER — POTASSIUM CHLORIDE IN NACL 20-0.9 MEQ/L-% IV SOLN
INTRAVENOUS | Status: DC
  Filled 2021-08-19 (×7): qty 1000

## 2021-08-19 MED ORDER — OXYCODONE HCL 5 MG PO TABS: 5.0000 mg | ORAL_TABLET | ORAL | Status: DC | PRN

## 2021-08-19 MED ORDER — ONDANSETRON HCL 4 MG/2ML IJ SOLN: 4.0000 mg | Freq: Four times a day (QID) | INTRAMUSCULAR | Status: DC | PRN

## 2021-08-19 MED ORDER — LACTATED RINGERS IV SOLN: INTRAVENOUS | Status: DC

## 2021-08-19 MED ORDER — GABAPENTIN 300 MG PO CAPS
ORAL_CAPSULE | ORAL | Status: AC
  Administered 2021-08-19: 300 mg via ORAL
  Filled 2021-08-19: qty 1

## 2021-08-19 MED ORDER — SODIUM CHLORIDE 0.9 % IV SOLN
INTRAVENOUS | Status: AC
  Administered 2021-08-20: 2 g via INTRAVENOUS
  Filled 2021-08-19: qty 2

## 2021-08-19 MED ORDER — ROCURONIUM BROMIDE 100 MG/10ML IV SOLN
INTRAVENOUS | Status: DC | PRN
  Administered 2021-08-19: 50 mg via INTRAVENOUS

## 2021-08-19 MED ORDER — ONDANSETRON HCL 4 MG/2ML IJ SOLN
INTRAMUSCULAR | Status: DC | PRN
  Administered 2021-08-19: 4 mg via INTRAVENOUS

## 2021-08-19 MED ORDER — PROCHLORPERAZINE EDISYLATE 10 MG/2ML IJ SOLN
5.0000 mg | Freq: Four times a day (QID) | INTRAMUSCULAR | Status: DC | PRN
  Filled 2021-08-19: qty 2

## 2021-08-19 MED ORDER — ORAL CARE MOUTH RINSE: 15.0000 mL | Freq: Once | OROMUCOSAL | Status: DC

## 2021-08-19 MED ORDER — FENTANYL CITRATE (PF) 100 MCG/2ML IJ SOLN
25.0000 ug | INTRAMUSCULAR | Status: AC | PRN
  Administered 2021-08-19 (×6): 25 ug via INTRAVENOUS

## 2021-08-19 MED ORDER — SUCCINYLCHOLINE CHLORIDE 200 MG/10ML IV SOSY
PREFILLED_SYRINGE | INTRAVENOUS | Status: DC | PRN
  Administered 2021-08-19: 120 mg via INTRAVENOUS

## 2021-08-19 MED ORDER — LACTATED RINGERS IV SOLN: INTRAVENOUS | Status: DC | PRN

## 2021-08-19 MED ORDER — LIDOCAINE HCL (CARDIAC) PF 100 MG/5ML IV SOSY
PREFILLED_SYRINGE | INTRAVENOUS | Status: DC | PRN
  Administered 2021-08-19: 80 mg via INTRAVENOUS

## 2021-08-19 MED ORDER — ACETAMINOPHEN 500 MG PO TABS
1000.0000 mg | ORAL_TABLET | Freq: Four times a day (QID) | ORAL | Status: DC
  Administered 2021-08-20 – 2021-08-21 (×2): 1000 mg via ORAL

## 2021-08-19 MED ORDER — CHLORHEXIDINE GLUCONATE 0.12 % MT SOLN
OROMUCOSAL | Status: AC
  Administered 2021-08-19: 15 mL
  Filled 2021-08-19: qty 15

## 2021-08-19 MED ORDER — ONDANSETRON 4 MG PO TBDP: 4.0000 mg | ORAL_TABLET | Freq: Four times a day (QID) | ORAL | Status: DC | PRN

## 2021-08-19 MED ORDER — KETOROLAC TROMETHAMINE 15 MG/ML IJ SOLN: 15.0000 mg | Freq: Four times a day (QID) | INTRAMUSCULAR | Status: DC

## 2021-08-19 MED ORDER — MIDAZOLAM HCL 2 MG/2ML IJ SOLN
INTRAMUSCULAR | Status: DC | PRN
  Administered 2021-08-19: 2 mg via INTRAVENOUS

## 2021-08-19 MED ORDER — PROPOFOL 10 MG/ML IV BOLUS
INTRAVENOUS | Status: DC | PRN
  Administered 2021-08-19: 200 mg via INTRAVENOUS

## 2021-08-19 MED ORDER — ROCURONIUM BROMIDE 10 MG/ML (PF) SYRINGE
PREFILLED_SYRINGE | INTRAVENOUS | Status: AC
  Filled 2021-08-19: qty 10

## 2021-08-19 MED ORDER — DEXAMETHASONE SODIUM PHOSPHATE 10 MG/ML IJ SOLN
INTRAMUSCULAR | Status: AC
  Filled 2021-08-19: qty 1

## 2021-08-19 MED ORDER — LIDOCAINE HCL (PF) 2 % IJ SOLN
INTRAMUSCULAR | Status: AC
  Filled 2021-08-19: qty 5

## 2021-08-19 MED ORDER — DEXAMETHASONE SODIUM PHOSPHATE 10 MG/ML IJ SOLN
INTRAMUSCULAR | Status: DC | PRN
  Administered 2021-08-19: 10 mg via INTRAVENOUS

## 2021-08-19 MED ORDER — ALBUMIN HUMAN 5 % IV SOLN: INTRAVENOUS | Status: DC | PRN

## 2021-08-19 SURGICAL SUPPLY — 78 items
BAG LAPAROSCOPIC 12 15 PORT 16 (BASKET) IMPLANT
BAG RETRIEVAL 12/15 (BASKET)
CANNULA REDUC XI 12-8 STAPL (CANNULA) ×1
CANNULA REDUCER 12-8 DVNC XI (CANNULA) ×1 IMPLANT
CHLORAPREP W/TINT 26 (MISCELLANEOUS) ×2 IMPLANT
CLIP LIGATING HEMO O LOK GREEN (MISCELLANEOUS) IMPLANT
COVER TIP SHEARS 8 DVNC (MISCELLANEOUS) ×1 IMPLANT
COVER TIP SHEARS 8MM DA VINCI (MISCELLANEOUS) ×1
DECANTER SPIKE VIAL GLASS SM (MISCELLANEOUS) IMPLANT
DEFOGGER SCOPE WARMER CLEARIFY (MISCELLANEOUS) ×2 IMPLANT
DERMABOND ADVANCED (GAUZE/BANDAGES/DRESSINGS) ×1
DERMABOND ADVANCED .7 DNX12 (GAUZE/BANDAGES/DRESSINGS) ×1 IMPLANT
DRAPE 3/4 80X56 (DRAPES) IMPLANT
DRAPE ARM DVNC X/XI (DISPOSABLE) ×4 IMPLANT
DRAPE COLUMN DVNC XI (DISPOSABLE) ×1 IMPLANT
DRAPE DA VINCI XI ARM (DISPOSABLE) ×4
DRAPE DA VINCI XI COLUMN (DISPOSABLE) ×1
ELECT BLADE 6.5 EXT (BLADE) ×2 IMPLANT
ELECT CAUTERY BLADE 6.4 (BLADE) ×2 IMPLANT
ELECT REM PT RETURN 9FT ADLT (ELECTROSURGICAL) ×2
ELECTRODE REM PT RTRN 9FT ADLT (ELECTROSURGICAL) ×1 IMPLANT
GAUZE 4X4 16PLY ~~LOC~~+RFID DBL (SPONGE) ×2 IMPLANT
GLOVE SURG ENC MOIS LTX SZ7 (GLOVE) ×6 IMPLANT
GOWN STRL REUS W/ TWL LRG LVL3 (GOWN DISPOSABLE) ×3 IMPLANT
GOWN STRL REUS W/TWL LRG LVL3 (GOWN DISPOSABLE) ×3
GRASPER LAPSCPC 5X45 DSP (INSTRUMENTS) IMPLANT
HANDLE YANKAUER SUCT BULB TIP (MISCELLANEOUS) ×2 IMPLANT
IRRIGATION STRYKERFLOW (MISCELLANEOUS) IMPLANT
IRRIGATOR STRYKERFLOW (MISCELLANEOUS)
IV NS 1000ML (IV SOLUTION)
IV NS 1000ML BAXH (IV SOLUTION) IMPLANT
KIT IMAGING PINPOINTPAQ (MISCELLANEOUS) IMPLANT
KIT PINK PAD W/HEAD ARE REST (MISCELLANEOUS) ×2 IMPLANT
KIT PINK PAD W/HEAD ARM REST (MISCELLANEOUS) ×1 IMPLANT
LABEL OR SOLS (LABEL) IMPLANT
MANIFOLD NEPTUNE II (INSTRUMENTS) ×2 IMPLANT
NEEDLE HYPO 22GX1.5 SAFETY (NEEDLE) ×2 IMPLANT
NS IRRIG 500ML POUR BTL (IV SOLUTION) ×2 IMPLANT
OBTURATOR OPTICAL STANDARD 8MM (TROCAR) ×1
OBTURATOR OPTICAL STND 8 DVNC (TROCAR) ×1
OBTURATOR OPTICALSTD 8 DVNC (TROCAR) ×1 IMPLANT
PACK COLON CLEAN CLOSURE (MISCELLANEOUS) IMPLANT
PACK LAP CHOLECYSTECTOMY (MISCELLANEOUS) ×2 IMPLANT
PENCIL ELECTRO HAND CTR (MISCELLANEOUS) ×2 IMPLANT
PORT ACCESS TROCAR AIRSEAL 5 (TROCAR) ×2 IMPLANT
RELOAD STAPLER 2.5X60 WHT DVNC (STAPLE) IMPLANT
RELOAD STAPLER 3.5X60 BLU DVNC (STAPLE) IMPLANT
SEAL CANN UNIV 5-8 DVNC XI (MISCELLANEOUS) ×3 IMPLANT
SEAL XI 5MM-8MM UNIVERSAL (MISCELLANEOUS) ×3
SEALER VESSEL DA VINCI XI (MISCELLANEOUS)
SEALER VESSEL EXT DVNC XI (MISCELLANEOUS) IMPLANT
SET TRI-LUMEN FLTR TB AIRSEAL (TUBING) ×2 IMPLANT
SOLUTION ELECTROLUBE (MISCELLANEOUS) ×2 IMPLANT
SPONGE T-LAP 18X18 ~~LOC~~+RFID (SPONGE) ×6 IMPLANT
SPONGE T-LAP 4X18 ~~LOC~~+RFID (SPONGE) ×2 IMPLANT
STAPLER 60 DA VINCI SURE FORM (STAPLE)
STAPLER 60 SUREFORM DVNC (STAPLE) IMPLANT
STAPLER CANNULA SEAL DVNC XI (STAPLE) ×1 IMPLANT
STAPLER CANNULA SEAL XI (STAPLE) ×1
STAPLER RELOAD 2.5X60 WHITE (STAPLE)
STAPLER RELOAD 2.5X60 WHT DVNC (STAPLE)
STAPLER RELOAD 3.5X60 BLU DVNC (STAPLE)
STAPLER RELOAD 3.5X60 BLUE (STAPLE)
SUT DVC VLOC 3-0 CL 6 P-12 (SUTURE) IMPLANT
SUT MNCRL AB 4-0 PS2 18 (SUTURE) ×2 IMPLANT
SUT PDS AB 0 CT1 27 (SUTURE) ×4 IMPLANT
SUT SILK 2 0 SH (SUTURE) IMPLANT
SUT V-LOC 90 ABS 3-0 VLT  V-20 (SUTURE) ×1
SUT V-LOC 90 ABS 3-0 VLT V-20 (SUTURE) ×1 IMPLANT
SUT VICRYL 0 AB UR-6 (SUTURE) IMPLANT
SUT VLOC 90 S/L VL9 GS22 (SUTURE) IMPLANT
SYR 20ML LL LF (SYRINGE) ×4 IMPLANT
SYS TROCAR 1.5-3 SLV ABD GEL (ENDOMECHANICALS)
SYSTEM TROCR 1.5-3 SLV ABD GEL (ENDOMECHANICALS) IMPLANT
TRAY FOLEY MTR SLVR 16FR STAT (SET/KITS/TRAYS/PACK) IMPLANT
TRAY FOLEY SLVR 16FR LF STAT (SET/KITS/TRAYS/PACK) ×2 IMPLANT
TROCAR ADV FIXATION 12X100MM (TROCAR) IMPLANT
WATER STERILE IRR 500ML POUR (IV SOLUTION) ×2 IMPLANT

## 2021-08-19 NOTE — Anesthesia Preprocedure Evaluation (Signed)
Anesthesia Evaluation  Patient identified by MRN, date of birth, ID band Patient awake    Reviewed: Allergy & Precautions, NPO status , Patient's Chart, lab work & pertinent test results  History of Anesthesia Complications Negative for: history of anesthetic complications  Airway Mallampati: III  TM Distance: >3 FB Neck ROM: Full    Dental  (+) Teeth Intact, Dental Advidsory Given   Pulmonary neg pulmonary ROS, neg shortness of breath, neg sleep apnea, neg COPD, neg recent URI, Patient abstained from smoking.Not current smoker,    Pulmonary exam normal breath sounds clear to auscultation       Cardiovascular Exercise Tolerance: Good METShypertension, Pt. on medications (-) angina(-) CAD and (-) Past MI (-) dysrhythmias  Rhythm:Regular Rate:Normal - Systolic murmurs    Neuro/Psych neg Seizures negative neurological ROS  negative psych ROS   GI/Hepatic neg GERD  ,(+)     (-) substance abuse  ,   Endo/Other  neg diabetes  Renal/GU negative Renal ROS     Musculoskeletal   Abdominal   Peds  Hematology   Anesthesia Other Findings Past Medical History: No date: Hypertension  Reproductive/Obstetrics                             Anesthesia Physical  Anesthesia Plan  ASA: 2  Anesthesia Plan: General   Post-op Pain Management:    Induction: Intravenous, Rapid sequence and Cricoid pressure planned  PONV Risk Score and Plan: 4 or greater and Ondansetron, Dexamethasone, Midazolam, Treatment may vary due to age or medical condition and Aprepitant  Airway Management Planned: Oral ETT  Additional Equipment: None  Intra-op Plan:   Post-operative Plan: Extubation in OR  Informed Consent: I have reviewed the patients History and Physical, chart, labs and discussed the procedure including the risks, benefits and alternatives for the proposed anesthesia with the patient or authorized  representative who has indicated his/her understanding and acceptance.     Dental advisory given  Plan Discussed with: CRNA and Surgeon  Anesthesia Plan Comments: (Discussed risks of anesthesia with patient, including PONV, sore throat, lip/dental damage. Rare risks discussed as well, such as cardiorespiratory and neurological sequelae, and allergic reactions. Discussed the role of CRNA in patient's perioperative care. Patient understands.)        Anesthesia Quick Evaluation

## 2021-08-19 NOTE — Interval H&P Note (Signed)
History and Physical Interval Note:  08/19/2021 5:21 PM  Keith Mills  has presented today for surgery, with the diagnosis of bowel obstruction.  The various methods of treatment have been discussed with the patient and family. After consideration of risks, benefits and other options for treatment, the patient has consented to  Procedure(s): XI ROBOTIC ASSISTED DIAGNOSTIC LAPAROSCOPY (N/A) as a surgical intervention.  The patient's history has been reviewed, patient examined, no change in status, stable for surgery.  I have reviewed the patient's chart and labs.  Questions were answered to the patient's satisfaction.     [place]

## 2021-08-19 NOTE — Op Note (Signed)
Robotic assisted laparoscopic Enterolysis  Pre-operative Diagnosis: Small obstruction  Post-operative Diagnosis: Same  Procedure:  Robotic assisted laparoscopic Enterolysis   Surgeon: Caroleen Hamman, MD FACS  Anesthesia: Gen. with endotracheal tube  Findings: Small bowel obstruction from a single adhesive band  near the root of the mesentery Anastomosis with reactive surgical changes but w/o fistula, abscess or leak  Estimated Blood Loss: 5cc              Complications: none   Procedure Details  The patient was seen again in the Holding Room. The benefits, complications, treatment options, and expected outcomes were discussed with the patient. The risks of bleeding, infection, recurrence of symptoms, failure to resolve symptoms,  bowel damage,  any of which could require further surgery were reviewed with the patient. The likelihood of improving the patient's symptoms with return to their baseline status is good.  The patient and/or family concurred with the proposed plan, giving informed consent.  The patient was taken to Operating Room, identified  and the procedure verified A Time Out was held and the above information confirmed.  Prior to the induction of general anesthesia, antibiotic prophylaxis was administered. VTE prophylaxis was in place. General endotracheal anesthesia was then administered and tolerated well. After the induction, the abdomen was prepped with Chloraprep and draped in the sterile fashion. The patient was positioned in the supine position.  I used a veres needle placing it Palmer's point. Saline drop test confirmed adequate placement. Pneumoperitoneum was then created with CO2 and tolerated well without any adverse changes in the patient's vital signs. No evidence of bowel injuries observed,  four 8-mm ports were placed under direct vision the first one with optiview technique. All skin incisions  were infiltrated with a local anesthetic agent before making the  incision and placing the trocars.   The patient was positioned  in reverse Trendelenburg, robot was brought to the surgical field and docked in the standard fashion.  We made sure all the instrumentation was kept indirect view at all times and that there were no collision between the arms. I scrubbed out and went to the console. The Small bowel was run findings a discrete transition point from an adhesive band at the base of the mesentery close to the anastomosis. I was able to reduce the internal hernia, as some of the bowel was herniated via the mesenteric defect. We were able to reduce the bowel and release the band that was causing the obstruction. No bleeding, bile duct injury or leak, or bowel injury was noted. Robotic instruments and robotic arms were undocked in the standard fashion.  I scrubbed back in.  Pneumoperitoneum was released.  4-0 subcuticular Monocryl was used to close the skin incisions. Dermabond was  applied.  The patient was then extubated and brought to the recovery room in stable condition. Sponge, lap, and needle counts were correct at closure and at the conclusion of the case.               Caroleen Hamman, MD, FACS

## 2021-08-19 NOTE — Anesthesia Procedure Notes (Signed)
Procedure Name: Intubation Date/Time: 08/19/2021 6:09 PM Performed by: Jerrye Noble, CRNA Pre-anesthesia Checklist: Patient identified, Emergency Drugs available, Suction available and Patient being monitored Patient Re-evaluated:Patient Re-evaluated prior to induction Oxygen Delivery Method: Circle system utilized Preoxygenation: Pre-oxygenation with 100% oxygen Induction Type: IV induction and Rapid sequence Ventilation: Mask ventilation without difficulty Laryngoscope Size: McGraph and 4 Grade View: Grade I Tube type: Oral Tube size: 7.5 mm Number of attempts: 1 Airway Equipment and Method: Stylet and Video-laryngoscopy Placement Confirmation: ETT inserted through vocal cords under direct vision, positive ETCO2 and breath sounds checked- equal and bilateral Secured at: 23 cm Tube secured with: Tape Dental Injury: Teeth and Oropharynx as per pre-operative assessment

## 2021-08-19 NOTE — Transfer of Care (Signed)
Immediate Anesthesia Transfer of Care Note  Patient: Keith Mills  Procedure(s) Performed: XI ROBOTIC ASSISTED DIAGNOSTIC LAPAROSCOPY  Patient Location: PACU  Anesthesia Type:General  Level of Consciousness: drowsy  Airway & Oxygen Therapy: Patient Spontanous Breathing and Patient connected to face mask oxygen  Post-op Assessment: Report given to RN  Post vital signs: stable  Last Vitals:  Vitals Value Taken Time  BP    Temp    Pulse    Resp    SpO2      Last Pain:  Vitals:   08/19/21 1310  TempSrc: Temporal  PainSc: 0-No pain         Complications: No notable events documented.

## 2021-08-19 NOTE — Progress Notes (Signed)
Dr Dahlia Byes was notified of NG tube placement and he suggested we advance tube like x-ray suggested.Upon assessment with Jackelyn Poling, Rn with auscultation the tube was placed in correct place with 2 nurses verifying. Patient is very addiment about the tube coming out in the am.

## 2021-08-19 NOTE — Anesthesia Postprocedure Evaluation (Signed)
Anesthesia Post Note  Patient: Keith Mills  Procedure(s) Performed: XI ROBOTIC ASSISTED DIAGNOSTIC LAPAROSCOPY  Patient location during evaluation: PACU Anesthesia Type: General Level of consciousness: awake and alert Pain management: pain level controlled Vital Signs Assessment: post-procedure vital signs reviewed and stable Respiratory status: spontaneous breathing, nonlabored ventilation, respiratory function stable and patient connected to nasal cannula oxygen Cardiovascular status: blood pressure returned to baseline and stable Postop Assessment: no apparent nausea or vomiting Anesthetic complications: no   No notable events documented.   Last Vitals:  Vitals:   08/19/21 2045 08/19/21 2100  BP: 140/76 133/78  Pulse: (!) 106 (!) 106  Resp: 20 19  Temp:    SpO2: 100% 99%    Last Pain:  Vitals:   08/19/21 2100  TempSrc:   PainSc: 3                  Arita Miss

## 2021-08-19 NOTE — Progress Notes (Signed)
Patient come from PACU. SpO2 60's. Simple mask applied. Patient is holding his breath does not want to breathe deep. Patient's oxygen went back up to 96%. Patient placed on [place]. Patient also got morphine in PACU before coming over. Patient is upset, the surgeon told him he would not have a NG tube after surgery. Gave patient emotional support. Patient seems anxious. Will continue to comfort and support patient.

## 2021-08-20 ENCOUNTER — Observation Stay: Payer: 59

## 2021-08-20 DIAGNOSIS — I10 Essential (primary) hypertension: Secondary | ICD-10-CM | POA: Diagnosis not present

## 2021-08-20 DIAGNOSIS — K6389 Other specified diseases of intestine: Secondary | ICD-10-CM | POA: Diagnosis not present

## 2021-08-20 DIAGNOSIS — K56699 Other intestinal obstruction unspecified as to partial versus complete obstruction: Secondary | ICD-10-CM | POA: Diagnosis not present

## 2021-08-20 DIAGNOSIS — Z9049 Acquired absence of other specified parts of digestive tract: Secondary | ICD-10-CM | POA: Diagnosis not present

## 2021-08-20 DIAGNOSIS — K567 Ileus, unspecified: Secondary | ICD-10-CM | POA: Diagnosis not present

## 2021-08-20 DIAGNOSIS — Z4682 Encounter for fitting and adjustment of non-vascular catheter: Secondary | ICD-10-CM | POA: Diagnosis not present

## 2021-08-20 LAB — BASIC METABOLIC PANEL
Anion gap: 10 (ref 5–15)
BUN: 41 mg/dL — ABNORMAL HIGH (ref 6–20)
CO2: 28 mmol/L (ref 22–32)
Calcium: 8.9 mg/dL (ref 8.9–10.3)
Chloride: 94 mmol/L — ABNORMAL LOW (ref 98–111)
Creatinine, Ser: 2.89 mg/dL — ABNORMAL HIGH (ref 0.61–1.24)
GFR, Estimated: 26 mL/min — ABNORMAL LOW (ref >60)
Glucose, Bld: 140 mg/dL — ABNORMAL HIGH (ref 70–99)
Potassium: 4.4 mmol/L (ref 3.5–5.1)
Sodium: 132 mmol/L — ABNORMAL LOW (ref 135–145)

## 2021-08-20 LAB — CBC
HCT: 38.6 % — ABNORMAL LOW (ref 39.0–52.0)
Hemoglobin: 13.5 g/dL (ref 13.0–17.0)
MCH: 30.5 pg (ref 26.0–34.0)
MCHC: 35 g/dL (ref 30.0–36.0)
MCV: 87.1 fL (ref 80.0–100.0)
Platelets: 226 10*3/uL (ref 150–400)
RBC: 4.43 MIL/uL (ref 4.22–5.81)
RDW: 12.2 % (ref 11.5–15.5)
WBC: 14.6 10*3/uL — ABNORMAL HIGH (ref 4.0–10.5)
nRBC: 0 % (ref 0.0–0.2)

## 2021-08-20 MED ORDER — MORPHINE SULFATE (PF) 2 MG/ML IV SOLN
INTRAVENOUS | Status: AC
  Administered 2021-08-20: 2 mg via INTRAVENOUS
  Filled 2021-08-20: qty 1

## 2021-08-20 MED ORDER — KETOROLAC TROMETHAMINE 15 MG/ML IJ SOLN
INTRAMUSCULAR | Status: AC
  Administered 2021-08-20: 15 mg via INTRAVENOUS
  Filled 2021-08-20: qty 1

## 2021-08-20 MED ORDER — ENSURE ENLIVE PO LIQD
237.0000 mL | Freq: Three times a day (TID) | ORAL | Status: DC
  Administered 2021-08-20: 237 mL via ORAL

## 2021-08-20 MED ORDER — ACETAMINOPHEN 500 MG PO TABS
ORAL_TABLET | ORAL | Status: AC
  Filled 2021-08-20: qty 2

## 2021-08-20 MED ORDER — SODIUM CHLORIDE 0.9 % IV SOLN
INTRAVENOUS | Status: AC
  Filled 2021-08-20: qty 2

## 2021-08-20 MED ORDER — ACETAMINOPHEN 500 MG PO TABS
ORAL_TABLET | ORAL | Status: AC
  Administered 2021-08-20: 1000 mg via ORAL
  Filled 2021-08-20: qty 2

## 2021-08-20 NOTE — Progress Notes (Signed)
[facility] Day(s): 0.   Post op day(s): 1 Day Post-Op.   Interval History:  Patient seen and examined No acute events or new complaints overnight.  Patient reports he is anxious to get the NGT out No significant abdominal pain, nausea, emesis  He does have a leukocytosis; 14.8K; likely reactive from OR Does appear to have AKI; sCr - 2.89 NGT output no clearly recorded KUB does have air and stool throughout the colon, no significant small bowel distension He is passing flatus  Vital signs in last 24 hours: [min-max] current  Temp:  [96.8 F (36 C)-98.1 F (36.7 C)] 98.1 F (36.7 C) (12/07 0801) Pulse Rate:  [82-108] 90 (12/07 0801) Resp:  [16-25] 19 (12/07 0100) BP: (121-155)/(70-100) 140/80 (12/07 0801) SpO2:  [69 %-100 %] 97 % (12/07 0801) Weight:  [95.3 kg] 95.3 kg (12/06 1310)     Height: 5\' 8"  (172.7 cm) Weight: 95.3 kg BMI (Calculated): 31.94   Intake/Output last 2 shifts:  12/06 0701 - 12/07 0700 In: 2300 [I.V.:1700; IV Piggyback:600] Out: 340 [Urine:130; Blood:10]   Physical Exam:  Constitutional: alert, cooperative and no distress  HEENT: NGT in place (Removed) Respiratory: breathing non-labored at rest  Cardiovascular: regular rate and sinus rhythm  Gastrointestinal: Soft, non-tender, non-distended, no rebound/guarding Integumentary: Laparoscopic incisions are CDI with dermabond  Labs:  CBC Latest Ref Rng & Units 08/20/2021 08/19/2021 07/19/2021  WBC 4.0 - 10.5 K/uL 14.6(H) 16.1(H) 7.1  Hemoglobin 13.0 - 17.0 g/dL 13.5 13.3 11.5(L)  Hematocrit 39.0 - 52.0 % 38.6(L) 38.2(L) 34.4(L)  Platelets 150 - 400 K/uL 226 243 173   CMP Latest Ref Rng & Units 08/20/2021 08/19/2021 07/19/2021  Glucose 70 - 99 mg/dL 140(H) - 102(H)  BUN 6 - 20 mg/dL 41(H) - 12  Creatinine 0.61 - 1.24 mg/dL 2.89(H) 2.98(H) 1.36(H)  Sodium 135 - 145 mmol/L 132(L) - 138  Potassium 3.5 - 5.1 mmol/L 4.4 - 3.9  Chloride 98 - 111 mmol/L 94(L) -  107  CO2 22 - 32 mmol/L 28 - 26  Calcium 8.9 - 10.3 mg/dL 8.9 - 8.3(L)  Total Protein 6.5 - 8.1 g/dL - - -  Total Bilirubin 0.3 - 1.2 mg/dL - - -  Alkaline Phos 38 - 126 U/L - - -  AST 15 - 41 U/L - - -  ALT 0 - 44 U/L - - -     Imaging studies:  KUB (08/20/2021) personally reviewed showing improvement in small bowel distension, now with air and contrast in colon, and radiologist report reviewed below:  IMPRESSION: 1. Interval decrease in gaseous small bowel distension. 2. NG tube tip is in the mid stomach with proximal side port in the region of the GE junction.   Assessment/Plan:  50 y.o. male 1 Day Post-Op s/p robotic assisted laparoscopic enterolysis for internal hernia ~5 weeks s/p robotic assisted laparoscopic left colectomy.   - Remove NGT this morning - Start CLD; advance as tolerated   - Continue IVF support for AKI - Monitor abdominal examination; on-going bowel function    - Pain control prn; antiemetics   - Monitor leukocytosis; renal function - Mobilization as tolerated  - Discharge Planning: Pending advancement of diet, anticipate in next 24 hours   All of the above findings and recommendations were discussed with the patient, patient's family (wife at bedside), and the medical team, and all of patient's and family's questions were answered to their expressed satisfaction.  -- Edison Simon, PA-C [facility] 08/20/2021, 8:41 AM [phone] M-F: 7am - 4pm

## 2021-08-20 NOTE — Progress Notes (Addendum)
Initial Nutrition Assessment  DOCUMENTATION CODES:   Obesity unspecified  INTERVENTION:   Ensure Enlive po TID, each supplement provides 350 kcal and 20 grams of protein  Pt at high refeed risk; recommend monitor potassium, magnesium and phosphorus labs daily until stable  NUTRITION DIAGNOSIS:   Increased nutrient needs related to post-op healing as evidenced by estimated needs.  GOAL:   Patient will meet greater than or equal to 90% of their needs  MONITOR:   PO intake, Supplement acceptance, Labs, Weight trends, Skin, I & O's  REASON FOR ASSESSMENT:   Malnutrition Screening Tool    ASSESSMENT:   50 y/o male with h/o HTN and high-grade dysplasia polyp s/p robotic assisted laparoscopic left colectomy 61/6 complicated by post surgical SBO now s/p robotic assisted laparoscopic enterolysis for internal hernia 12/6.  Met with pt in room today. Pt reports good appetite and oral intake at baseline but reports decreased oral intake for the past month r/t colon mass, colectomy and now SBO. Pt reports a 20lb weight loss over the past month. Per chart, pt is down 20lbs(9%) over the past month; this is significant. RD will add supplements and vitamins to help pt meet his estimated needs. RD discussed with pt the importance of adequate nutrition; pt is willing to drink Ensure in hospital. Pt reports eating 100% of his clear liquid tray. Pt now advanced to full liquids and is excited to order dinner.   Medications reviewed and include: lovenox, protonix, NaCl w/ Kcl @150ml /hr, cefotetan  Labs reviewed: Na 132(L), K 4.4 wnl, BUN 41(H), creat 2.89(H) Wbc- 14.6(H)  NUTRITION - FOCUSED PHYSICAL EXAM:  Flowsheet Row Most Recent Value  Orbital Region No depletion  Upper Arm Region No depletion  Thoracic and Lumbar Region No depletion  Buccal Region No depletion  Temple Region No depletion  Clavicle Bone Region No depletion  Clavicle and Acromion Bone Region No depletion  Scapular Bone  Region No depletion  Dorsal Hand No depletion  Patellar Region No depletion  Anterior Thigh Region No depletion  Posterior Calf Region No depletion  Edema (RD Assessment) None  Hair Reviewed  Eyes Reviewed  Mouth Reviewed  Skin Reviewed  Nails Reviewed   Diet Order:   Diet Order             Diet full liquid Room service appropriate? Yes; Fluid consistency: Thin  Diet effective 1400                  EDUCATION NEEDS:   Education needs have been addressed  Skin:  Skin Assessment: Reviewed RN Assessment (incision abdomen)  Last BM:  11/29  Height:   Ht Readings from Last 1 Encounters:  08/19/21 5' 8"  (1.727 m)    Weight:   Wt Readings from Last 1 Encounters:  08/19/21 95.3 kg    Ideal Body Weight:  70 kg  BMI:  Body mass index is 31.93 kg/m.  Estimated Nutritional Needs:   Kcal:  2100-2400kcal/day  Protein:  105-120g/day  Fluid:  2.1-2.4L/day  Koleen Distance MS, RD, LDN Please refer to [facility] for RD and/or RD on-call/weekend/after hours pager

## 2021-08-20 NOTE — Progress Notes (Signed)
Bowel sounds active in all four quadrants. Patient feels food. Has tolerated advance his diet.

## 2021-08-20 NOTE — Progress Notes (Signed)
Patient asking about NG. Explained it was suctioning out content from his stomach and its to help his stomach rest. States he wants it out. I explained to him I cannot take it out, I do not have an order and the surgeon wants it to stay in place. Patient wanted to know when they make their rounds, I told him and his wife around 7-8 am. Patient given pain medication. Patient is still very against having NG tube.

## 2021-08-21 ENCOUNTER — Other Ambulatory Visit: Payer: Self-pay

## 2021-08-21 DIAGNOSIS — I10 Essential (primary) hypertension: Secondary | ICD-10-CM | POA: Diagnosis not present

## 2021-08-21 DIAGNOSIS — K56609 Unspecified intestinal obstruction, unspecified as to partial versus complete obstruction: Secondary | ICD-10-CM

## 2021-08-21 DIAGNOSIS — K56699 Other intestinal obstruction unspecified as to partial versus complete obstruction: Secondary | ICD-10-CM | POA: Diagnosis not present

## 2021-08-21 DIAGNOSIS — Z9049 Acquired absence of other specified parts of digestive tract: Secondary | ICD-10-CM | POA: Diagnosis not present

## 2021-08-21 LAB — BASIC METABOLIC PANEL
Anion gap: 6 (ref 5–15)
BUN: 37 mg/dL — ABNORMAL HIGH (ref 6–20)
CO2: 29 mmol/L (ref 22–32)
Calcium: 8.3 mg/dL — ABNORMAL LOW (ref 8.9–10.3)
Chloride: 101 mmol/L (ref 98–111)
Creatinine, Ser: 2.44 mg/dL — ABNORMAL HIGH (ref 0.61–1.24)
GFR, Estimated: 31 mL/min — ABNORMAL LOW (ref >60)
Glucose, Bld: 116 mg/dL — ABNORMAL HIGH (ref 70–99)
Potassium: 3.7 mmol/L (ref 3.5–5.1)
Sodium: 136 mmol/L (ref 135–145)

## 2021-08-21 MED ORDER — OXYCODONE HCL 5 MG PO TABS
5.0000 mg | ORAL_TABLET | Freq: Four times a day (QID) | ORAL | 0 refills | Status: DC | PRN
  Filled 2021-08-21: qty 25, 7d supply, fill #0

## 2021-08-21 MED ORDER — SODIUM CHLORIDE FLUSH 0.9 % IV SOLN
INTRAVENOUS | Status: AC
  Filled 2021-08-21: qty 10

## 2021-08-21 MED ORDER — ACETAMINOPHEN 500 MG PO TABS
ORAL_TABLET | ORAL | Status: AC
  Filled 2021-08-21: qty 2

## 2021-08-21 NOTE — Discharge Instructions (Addendum)
In addition to included general post-operative instructions,  Diet: Resume home diet.   Activity: No heavy lifting >20 pounds (children, pets, laundry, garbage) or strenuous activity for 4 weeks, but light activity and walking are encouraged. Do not drive or drink alcohol if taking narcotic pain medications or having pain that might distract from driving.  Wound care: You may shower/get incision wet with soapy water and pat dry (do not rub incisions), but no baths or submerging incision underwater until follow-up.   Medications: Resume all home medications. For mild to moderate pain: acetaminophen (Tylenol) or ibuprofen/naproxen (if no kidney disease). Combining Tylenol with alcohol can substantially increase your risk of causing liver disease. Narcotic pain medications, if prescribed, can be used for severe pain, though may cause nausea, constipation, and drowsiness. Do not combine Tylenol and Percocet (or similar) within a 6 hour period as Percocet (and similar) contain(s) Tylenol. If you do not need the narcotic pain medication, you do not need to fill the prescription.  Call office ([phone] / [phone]) at any time if any questions, worsening pain, fevers/chills, bleeding, drainage from incision site, or other concerns.  

## 2021-08-21 NOTE — Discharge Summary (Signed)
[facility] SURGICAL DISCHARGE SUMMARY  Patient ID: Keith Mills MRN: [id number] DOB/AGE: January 03, 1971 50 y.o.  Admit date: 08/19/2021 Discharge date: 08/21/2021  Discharge Diagnoses Patient Active Problem List   Diagnosis Date Noted   SBO (small bowel obstruction) ([place]) 08/19/2021   S/P partial colectomy 07/17/2021   S/P laparoscopic colectomy 07/17/2021    Consultants None  Procedures 08/19/2021:  Robotic assisted laparoscopic lysis of adhesions  HPI: Keith Mills is a 50 y.o. male with history of left colectomy ~5 weeks ago with small bowel obstruction.   Hospital Course: Informed consent was obtained and documented, and patient underwent uneventful Robotic assisted laparoscopic lysis of adhesions with reduction of internal hernia (Dr Dahlia Byes, 08/19/2021).  Post-operatively, patient did well. NGT removed on POD1 after return of bowel function and radiographic resolution of SBO. He did have an acute on chronic kidney injury which responded to IVF. Advancement of patient's diet and ambulation were well-tolerated. The remainder of patient's hospital course was essentially unremarkable, and discharge planning was initiated accordingly with patient safely able to be discharged home with appropriate discharge instructions, pain control, and outpatient follow-up after all of his questions were answered to his expressed satisfaction.   Discharge Condition: Good   Physical Examination:  Constitutional: alert, cooperative and no distress  Respiratory: breathing non-labored at rest  Cardiovascular: regular rate and sinus rhythm  Gastrointestinal: Soft, non-tender, non-distended, no rebound/guarding Integumentary: Laparoscopic incisions are CDI with dermabond   Allergies as of 08/21/2021   No Known Allergies      Medication List     STOP taking these medications    bisacodyl 5 MG EC tablet Commonly known as: DULCOLAX       TAKE these medications     amLODipine 5 MG tablet Commonly known as: NORVASC Take 1 tablet (5 mg total) by mouth daily for blood pressure. (Take 1 tablet (5 mg total) by mouth daily. For blood pressure.)   amoxicillin-clavulanate 875-125 MG tablet Commonly known as: AUGMENTIN Take by mouth.   MULTIVITAMIN ADULT PO Take by mouth.   ondansetron 4 MG tablet Commonly known as: Zofran Take 1 tablet (4 mg total) by mouth every 8 (eight) hours as needed for nausea or vomiting.   oxyCODONE 5 MG immediate release tablet Commonly known as: Oxy IR/ROXICODONE Take 1 tablet (5 mg total) by mouth every 6 (six) hours as needed for severe pain or breakthrough pain.   tamsulosin 0.4 MG Caps capsule Commonly known as: FLOMAX TAKE 1 CAPSULE BY MOUTH ONCE DAILY FOR URINARY STREAM          Follow-up Information     Pabon, [place] F, MD. Schedule an appointment as soon as possible for a visit in 2 week(s).   Specialty: General Surgery Why: s/p laparoscopic lysis of adhesions Contact information: [address] [place] [address] [phone]         [facility]. Schedule an appointment as soon as possible for a visit in 1 week(s).   Specialty: Nephrology Why: new patient, CKD Contact information: [address] [place] [address] [phone]                  Time spent on discharge management including discussion of hospital course, clinical condition, outpatient instructions, prescriptions, and follow up with the patient and members of the medical team: >30 minutes  -- Edison Simon , PA-C [facility]  08/21/2021, 9:10 AM [phone] M-F: 7am - 4pm

## 2021-08-21 NOTE — Progress Notes (Signed)
Pt was discharged home per MD's orders. Pt verbalized fully understanding of discharged instructions. Questions were answered and pt was instructed to call the Dr's office if any concerns came up after being discharged. Continue to monitor.

## 2021-08-21 NOTE — Progress Notes (Signed)
Patient at 2200 got up and ambulated to the restroom independently. Patient given shower items to freshen up. Patient has a steady gait. No dizziness.

## 2021-08-21 NOTE — Progress Notes (Signed)
Pt was escorted on a wheelchair by hospital volunteer. Continue to monitor.

## 2021-08-25 ENCOUNTER — Telehealth: Payer: Self-pay | Admitting: *Deleted

## 2021-08-25 NOTE — Chronic Care Management (AMB) (Signed)
  Chronic Care Management   Outreach Note  08/25/2021 Name: JASMIN TRUMBULL MRN: [id number] DOB: [birth year]  [name] is a 50 y.o. year old male who is a primary care patient of Pleas Koch, NP. I reached out to [name] by phone today in response to a referral sent by Mr. Derell Bruun Lacuesta's primary care provider.  An unsuccessful telephone outreach was attempted today. The patient was referred to the case management team for assistance with care management and care coordination.   Follow Up Plan: A HIPAA compliant phone message was left for the patient providing contact information and requesting a return call.  If patient returns call to provider office, please advise to call Embedded Care Management Care Guide Librada Castronovo at [place], [facility]  Direct Dial: [phone]

## 2021-08-27 ENCOUNTER — Other Ambulatory Visit: Payer: Self-pay

## 2021-08-28 NOTE — Chronic Care Management (AMB) (Signed)
°  Care Management   Note  08/28/2021 Name: Keith Mills MRN: [id number] DOB: 10/05/1970  Keith Mills is a 50 y.o. year old male who is a primary care patient of Pleas Koch, NP. I reached out to Keith Mills by phone today in response to a referral sent by [name]'s primary care provider.   [name] was given information about care management services today including:  Care management services include personalized support from designated clinical staff supervised by his physician, including individualized plan of care and coordination with other care providers 24/7 contact phone numbers for assistance for urgent and routine care needs. The patient may stop care management services at any time by phone call to the office staff.  Patient agreed to services and verbal consent obtained.   Follow up plan: Telephone appointment with care management team member scheduled for: 10/13/2021  Julian Hy, [facility]  Direct Dial: [phone]

## 2021-09-02 ENCOUNTER — Other Ambulatory Visit (HOSPITAL_COMMUNITY): Payer: Self-pay

## 2021-09-03 ENCOUNTER — Encounter: Payer: Self-pay | Admitting: Surgery

## 2021-09-03 ENCOUNTER — Other Ambulatory Visit: Payer: Self-pay

## 2021-09-03 ENCOUNTER — Ambulatory Visit (INDEPENDENT_AMBULATORY_CARE_PROVIDER_SITE_OTHER): Payer: 59 | Admitting: Surgery

## 2021-09-03 VITALS — BP 117/71 | HR 91 | Temp 98.1°F | Ht 68.0 in | Wt 217.0 lb

## 2021-09-03 DIAGNOSIS — K565 Intestinal adhesions [bands], unspecified as to partial versus complete obstruction: Secondary | ICD-10-CM

## 2021-09-03 DIAGNOSIS — N179 Acute kidney failure, unspecified: Secondary | ICD-10-CM

## 2021-09-03 DIAGNOSIS — Z09 Encounter for follow-up examination after completed treatment for conditions other than malignant neoplasm: Secondary | ICD-10-CM

## 2021-09-03 NOTE — Patient Instructions (Addendum)
Please call our office with any questions or concerns. Please see your follow up appointment listed below. Please stop by the lab this week and have blood work done.

## 2021-09-05 NOTE — Progress Notes (Signed)
Keith Mills is 2 weeks out robotic LOA No pain.  No fevers no chills taking p.o. and having BMs GOOD UO  PE NAD ABd: Soft nontender incisions healing well without evidence of infection or hernias.  No peritonitis   A/P doing very well without surgical complications. Repeat BMP as he does have chronic kidney insufficiency. Pending nephrology referral RTC in a few months. lifting restrictions

## 2021-09-09 ENCOUNTER — Other Ambulatory Visit: Payer: Self-pay

## 2021-09-09 MED FILL — Amlodipine Besylate Tab 5 MG (Base Equivalent): ORAL | 90 days supply | Qty: 90 | Fill #1 | Status: AC

## 2021-09-09 MED FILL — Tamsulosin HCl Cap 0.4 MG: ORAL | 90 days supply | Qty: 90 | Fill #1 | Status: AC

## 2021-09-11 ENCOUNTER — Other Ambulatory Visit: Payer: Self-pay

## 2021-09-16 ENCOUNTER — Telehealth: Payer: Self-pay

## 2021-09-16 NOTE — Telephone Encounter (Signed)
Called patient to see if he has appointment for the Nephrologist and for [facility]-referrals were placed .has patient been scheduled.

## 2021-09-18 NOTE — Telephone Encounter (Signed)
Spoke with patient -he stated he was contacted the week before christmas and has not returned the call to Nephrology- we also discussed [facility] referral and he said he would call them as well.

## 2021-09-23 ENCOUNTER — Other Ambulatory Visit (HOSPITAL_COMMUNITY): Payer: Self-pay

## 2021-10-13 ENCOUNTER — Telehealth: Payer: 59

## 2021-10-30 ENCOUNTER — Telehealth: Payer: 59

## 2021-10-30 ENCOUNTER — Ambulatory Visit: Payer: 59

## 2021-10-30 DIAGNOSIS — I10 Essential (primary) hypertension: Secondary | ICD-10-CM

## 2021-10-30 NOTE — Chronic Care Management (AMB) (Signed)
Care Management    RN Visit Note  10/30/2021 Name: Keith Mills MRN: [id number] DOB: Apr 05, 1971  Subjective: Keith Mills is a 51 y.o. year old male who is a primary care patient of Keith Koch, NP. The care management team was consulted for assistance with disease management and care coordination needs.    Engaged with patient by telephone for initial visit in response to provider referral for case management and/or care coordination services.   Consent to Services:   [name] was given information about Care Management services today including:  Care Management services includes personalized support from designated clinical staff supervised by his physician, including individualized plan of care and coordination with other care providers 24/7 contact phone numbers for assistance for urgent and routine care needs. The patient may stop case management services at any time by phone call to the office staff.  Patient agreed to services and consent obtained.   Assessment: Review of patient past medical history, allergies, medications, health status, including review of consultants reports, laboratory and other test data, was performed as part of comprehensive evaluation and provision of chronic care management services.   SDOH (Social Determinants of Health) assessments and interventions performed:  SDOH Interventions    Flowsheet Row Most Recent Value  SDOH Interventions   Food Insecurity Interventions Intervention Not Indicated  Housing Interventions Intervention Not Indicated  Transportation Interventions Intervention Not Indicated        Care Plan  No Known Allergies  Outpatient Encounter Medications as of 10/30/2021  Medication Sig   amLODipine (NORVASC) 5 MG tablet Take 1 tablet (5 mg total) by mouth daily. For blood pressure.   Multiple Vitamin (MULTIVITAMIN ADULT PO) Take by mouth.   tamsulosin (FLOMAX) 0.4 MG CAPS capsule TAKE 1 CAPSULE BY MOUTH ONCE DAILY  FOR URINARY STREAM   ondansetron (ZOFRAN) 4 MG tablet Take 1 tablet (4 mg total) by mouth every 8 (eight) hours as needed for nausea or vomiting. (Patient not taking: Reported on 10/30/2021)   No facility-administered encounter medications on file as of 10/30/2021.    Patient Active Problem List   Diagnosis Date Noted   SBO (small bowel obstruction) ([place]) 08/19/2021   S/P partial colectomy 07/17/2021   S/P laparoscopic colectomy 07/17/2021   Weak urinary stream 10/22/2020   Essential hypertension 03/08/2020   Preventative health care 03/07/2019   Hematospermia 12/14/2013   Hydrocele 12/14/2013    Conditions to be addressed/monitored: HTN  Care Plan : [facility] Plan of care  Updates made by Dannielle Karvonen, RN since 10/30/2021 12:00 AM     Problem: Chronic disease management education and / or care coordination needs.   Priority: High     Long-Range Goal: Development of plan of care to address chronic disease management and/ or care coordination needs.   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  Patient states he lives with his wife and children.  Currently has returned to work status post his colectomy surgery.  Patient reports having follow up visit with general surgeon on 09/03/22.  He states he is doing very well, no complication.  Patient reports taking his medications as prescribed. He states he monitors his blood pressure only on occasion.  Patient states his doctor referred him to a  nephrologist [facility]).   He reports he is waiting for call regarding appointment date.   RNCM Clinical Goal(s):  Patient will verbalize understanding of plan for management of HTN as evidenced by patient self report and/ or notation in chart take all medications exactly as  prescribed and will call provider for medication related questions as evidenced by patient self report and /or notation chart.     attend all scheduled medical appointments:   as evidenced by patient self report and/ or notation in chart         continue to work with RN Care Manager and/or Social Worker to address care management and care coordination needs related to HTN as evidenced by adherence to CM Team Scheduled appointments     through collaboration with Consulting civil engineer, provider, and care team.   Interventions: 1:1 collaboration with primary care provider regarding development and update of comprehensive plan of care as evidenced by provider attestation and co-signature Inter-disciplinary care team collaboration (see longitudinal plan of care) Evaluation of current treatment plan related to  self management and patient's adherence to plan as established by provider   Hypertension Interventions:  (Status:  New goal.) Long Term Goal Last practice recorded BP readings:  BP Readings from Last 3 Encounters:  09/03/21 117/71  08/21/21 120/73  08/18/21 121/85  Most recent eGFR/CrCl: No results found for: EGFR  No components found for: CRCL  Evaluation of current treatment plan related to hypertension self management and patient's adherence to plan as established by provider Reviewed medications with patient and discussed importance of compliance Discussed plans with patient for ongoing care management follow up and provided patient with direct contact information for care management team Reviewed scheduled/upcoming provider appointments  Advised to check blood pressure 2-3 times per month and record.  Advised to follow low salt diet.   Patient Goals/Self-Care Activities: Take medications as prescribed   Attend all scheduled provider appointments Call pharmacy for medication refills 3-7 days in advance of running out of medications Call provider office for new concerns or questions   Monitor blood pressure 2-3 times per month Follow low salt diet. ( See education article sent to you in Mychart on low sodium diet. )        Plan: The patient has been provided with contact information for the care management team and has been advised to call with any health related questions or concerns.  The care management team will reach out to the patient again over the next 2 months .  Quinn Plowman RN,BSN,CCM RN Case Manager [place]  [phone]

## 2021-10-30 NOTE — Patient Instructions (Signed)
Visit Information  Thank you for taking time to visit with me today. Please don't hesitate to contact me if I can be of assistance to you before our next scheduled telephone appointment.  Following are the goals we discussed today:  Take medications as prescribed   Attend all scheduled provider appointments Call pharmacy for medication refills 3-7 days in advance of running out of medications Call provider office for new concerns or questions  Monitor blood pressure 2-3 times per month Follow low salt diet. ( See education article sent to you in Mychart on low sodium diet. )  Our next appointment is by telephone on 12/25/2021 at 10am  Please call the care guide team at [phone] if you need to cancel or reschedule your appointment.   If you are experiencing a Mental Health or [place] or need someone to talk to, please call the Suicide and Crisis Lifeline: 988 call 1-800-273-TALK (toll free, 24 hour hotline)   Following is a copy of your full plan of care:  Care Plan : [facility] Plan of care  Updates made by Dannielle Karvonen, RN since 10/30/2021 12:00 AM     Problem: Chronic disease management education and / or care coordination needs.   Priority: High     Long-Range Goal: Development of plan of care to address chronic disease management and/ or care coordination needs.   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  Patient states he lives with his wife and children.  Currently has returned to work status post his colectomy surgery.  Patient reports having follow up visit with general surgeon on 09/03/22.  He states he is doing very well, no complication.  Patient reports taking his medications as prescribed. He states he monitors his blood pressure only on  occasion.  Patient states his doctor referred him to a nephrologist [facility] kidney).   He reports he is waiting for call regarding appointment date.   RNCM Clinical Goal(s):  Patient will verbalize understanding of plan for management of HTN as evidenced by patient self report and/ or notation in chart take all medications exactly as prescribed and will call provider for medication related questions as evidenced by patient self report and /or notation chart.     attend all scheduled medical appointments:   as evidenced by patient self report and/ or notation in chart         continue to work with RN Care Manager and/or Social Worker to address care management and care coordination needs related to HTN as evidenced by adherence to CM Team Scheduled appointments     through collaboration with Consulting civil engineer, provider, and care team.   Interventions: 1:1 collaboration with primary care provider regarding development and update of comprehensive plan of care as evidenced by provider attestation and co-signature Inter-disciplinary care team collaboration (see longitudinal plan of care) Evaluation of current treatment plan related to  self management and patient's adherence to plan as established by provider   Hypertension Interventions:  (Status:  New goal.) Long Term Goal Last practice recorded BP readings:  BP Readings from Last 3 Encounters:  09/03/21 117/71  08/21/21 120/73  08/18/21 121/85  Most recent eGFR/CrCl: No results found for: EGFR  No components found for: CRCL  Evaluation of current treatment plan related to hypertension self management and patient's adherence to plan as established by provider Reviewed medications with patient and discussed importance of compliance Discussed plans with patient for ongoing care management follow up and provided patient with direct contact information for care management team Reviewed scheduled/upcoming provider appointments  Advised to  check blood pressure 2-3 times per month and record.  Advised to follow low salt diet.   Patient Goals/Self-Care Activities: Take medications as prescribed   Attend all scheduled provider appointments Call pharmacy for medication refills 3-7 days in advance of running out of medications Call provider office for new concerns or questions  Monitor blood pressure 2-3 times per month Follow low salt diet. ( See education article sent to you in Mychart on low sodium diet. )        Mr. Lovejoy was given information about Care Management services by the embedded care coordination team including:  Care Management services include personalized support from designated clinical staff supervised by his physician, including individualized plan of care and coordination with other care providers 24/7 contact phone numbers for assistance for urgent and routine care needs. The patient may stop CCM services at any time (effective at the end of the month) by phone call to the office staff.  Patient agreed to services and verbal consent obtained.   Patient verbalizes understanding of instructions and care plan provided today and agrees to view in [place]. Active MyChart status confirmed with patient.    The patient has been provided with contact information for the care management team and has been advised to call with any health related questions or concerns.  The care management team will reach out to the patient again over the next 2 montha .   Quinn Plowman RN,BSN,CCM RN Case Manager [place]  [phone]

## 2021-11-20 ENCOUNTER — Telehealth: Payer: Self-pay

## 2021-11-20 NOTE — Telephone Encounter (Signed)
Left message for patient to return call in regards to referral to Dr.Harmeet Candiss Norse. Need to know if patient is going to follow up. ?

## 2021-12-01 ENCOUNTER — Ambulatory Visit: Payer: 59 | Admitting: Surgery

## 2021-12-12 ENCOUNTER — Other Ambulatory Visit: Payer: Self-pay

## 2021-12-12 MED FILL — Tamsulosin HCl Cap 0.4 MG: ORAL | 90 days supply | Qty: 90 | Fill #2 | Status: AC

## 2021-12-12 MED FILL — Amlodipine Besylate Tab 5 MG (Base Equivalent): ORAL | 90 days supply | Qty: 90 | Fill #2 | Status: AC

## 2021-12-25 ENCOUNTER — Telehealth: Payer: Self-pay

## 2021-12-25 ENCOUNTER — Telehealth: Payer: 59

## 2021-12-25 NOTE — Telephone Encounter (Signed)
?  Care Management  ? ?Follow Up Note ? ? ?12/25/2021 ?Name: Keith Mills MRN: [id number] DOB: 28-Jun-1971 ? ? ?Referred by: Pleas Koch, NP ?Reason for referral : Care Coordination ? ? ?An unsuccessful telephone outreach was attempted today. The patient was referred to the case management team for assistance with care management and care coordination.  ? ?Follow Up Plan: A HIPPA compliant phone message was left for the patient providing contact information and requesting a return call.  The case management team will outreach to patient in 2 weeks.  ? ?Quinn Plowman RN,BSN,CCM ?RN Case Manager ?[place]  ?[phone] ? ? ?

## 2021-12-29 ENCOUNTER — Telehealth: Payer: Self-pay

## 2021-12-29 NOTE — Telephone Encounter (Signed)
Noted.  Will evaluate as scheduled. 

## 2021-12-29 NOTE — Telephone Encounter (Signed)
[place], [place], [place] ?Good morning,  ? ?This pt is requesting an office visit with Alma Friendly, NP to discuss BP meds and get labs, last office visit was 03/2021, thank you!  ? ?Staci [place], CCMA  ?Care Guide, Embedded Care Coordination  ?[place]  Care Management  ?Direct Dial: [phone]  ? ?Unable to reach pt by phone and I spoke with pts wife (DPR signed) she said she is not sure if pt is having any issues or not but if he is she will have pt mychart that info to [place]. Mrs Holtman scheduled appt with Gentry Fitz NP on 12/31/21 at 8:40 and pt will come fasting in case needed for labs. UC & ED precautions given to pts wife for pt and she voiced understanding. Sending note to Gentry Fitz NP and [facility] CMA. ? ? ? ? ? ? ?

## 2021-12-31 ENCOUNTER — Ambulatory Visit: Payer: 59 | Admitting: Primary Care

## 2021-12-31 ENCOUNTER — Encounter: Payer: Self-pay | Admitting: Primary Care

## 2021-12-31 VITALS — BP 132/82 | HR 78 | Temp 97.3°F | Ht 68.0 in | Wt 234.0 lb

## 2021-12-31 DIAGNOSIS — Z9049 Acquired absence of other specified parts of digestive tract: Secondary | ICD-10-CM | POA: Diagnosis not present

## 2021-12-31 DIAGNOSIS — I10 Essential (primary) hypertension: Secondary | ICD-10-CM

## 2021-12-31 DIAGNOSIS — N183 Chronic kidney disease, stage 3 unspecified: Secondary | ICD-10-CM | POA: Insufficient documentation

## 2021-12-31 DIAGNOSIS — R351 Nocturia: Secondary | ICD-10-CM | POA: Diagnosis not present

## 2021-12-31 DIAGNOSIS — R7303 Prediabetes: Secondary | ICD-10-CM

## 2021-12-31 DIAGNOSIS — N1831 Chronic kidney disease, stage 3a: Secondary | ICD-10-CM

## 2021-12-31 DIAGNOSIS — R3912 Poor urinary stream: Secondary | ICD-10-CM

## 2021-12-31 LAB — COMPREHENSIVE METABOLIC PANEL
ALT: 37 U/L (ref 0–53)
AST: 31 U/L (ref 0–37)
Albumin: 4.1 g/dL (ref 3.5–5.2)
Alkaline Phosphatase: 84 U/L (ref 39–117)
BUN: 19 mg/dL (ref 6–23)
CO2: 25 mEq/L (ref 19–32)
Calcium: 9.1 mg/dL (ref 8.4–10.5)
Chloride: 105 mEq/L (ref 96–112)
Creatinine, Ser: 1.45 mg/dL (ref 0.40–1.50)
GFR: 55.59 mL/min — ABNORMAL LOW (ref >60.00)
Glucose, Bld: 106 mg/dL — ABNORMAL HIGH (ref 70–99)
Potassium: 4.1 mEq/L (ref 3.5–5.1)
Sodium: 138 mEq/L (ref 135–145)
Total Bilirubin: 0.7 mg/dL (ref 0.2–1.2)
Total Protein: 6.9 g/dL (ref 6.0–8.3)

## 2021-12-31 LAB — CBC
HCT: 40.6 % (ref 39.0–52.0)
Hemoglobin: 13.8 g/dL (ref 13.0–17.0)
MCHC: 33.9 g/dL (ref 30.0–36.0)
MCV: 89.3 fl (ref 78.0–100.0)
Platelets: 208 10*3/uL (ref 150.0–400.0)
RBC: 4.55 Mil/uL (ref 4.22–5.81)
RDW: 13.4 % (ref 11.5–15.5)
WBC: 3.7 10*3/uL — ABNORMAL LOW (ref 4.0–10.5)

## 2021-12-31 LAB — HEMOGLOBIN A1C: Hgb A1c MFr Bld: 5.8 % (ref 4.6–6.5)

## 2021-12-31 LAB — PSA: PSA: 0.75 ng/mL (ref 0.10–4.00)

## 2021-12-31 LAB — LIPID PANEL
Cholesterol: 163 mg/dL (ref 0–200)
HDL: 46.7 mg/dL (ref >39.00)
LDL Cholesterol: 94 mg/dL (ref 0–99)
NonHDL: 116.09
Total CHOL/HDL Ratio: 3
Triglycerides: 110 mg/dL (ref 0.0–149.0)
VLDL: 22 mg/dL (ref 0.0–40.0)

## 2021-12-31 NOTE — Assessment & Plan Note (Signed)
Repeat A1c pending. 

## 2021-12-31 NOTE — Assessment & Plan Note (Signed)
Improved but with continued symptoms. ? ?I offered to increase tamsulosin to 0.8 mg daily, he currently declines.  Referral placed for urology evaluation to discuss alternative options. ? ?PSA pending. ?

## 2021-12-31 NOTE — Assessment & Plan Note (Signed)
Overall controlled. ? ?Recommended continued lifestyle changes. ?He will schedule an appointment with nephrology. ? ?Continue amlodipine 5 mg daily for now. ? ?Repeat labs pending. ?

## 2021-12-31 NOTE — Patient Instructions (Signed)
Stop by the lab prior to leaving today. I will notify you of your results once received.  ? ?You will be contacted regarding your referral to Urology.  Please let us know if you have not been contacted within two weeks.  ? ?It was a pleasure to see you today! ? ? ?

## 2021-12-31 NOTE — Assessment & Plan Note (Signed)
Repeat renal function pending today. ? ?Discussed the importance of blood pressure control, recommended he improve lifestyle changes for better blood pressure management. ? ?Continue amlodipine 5 mg daily for now. ?Strongly advised he set up an appointment with nephrology as recommended. ?

## 2021-12-31 NOTE — Assessment & Plan Note (Signed)
Reviewed hospital admissions from November and December 2022. ? ?Repeat labs pending today. ? ?Abdominal exam today unremarkable. ?

## 2021-12-31 NOTE — Progress Notes (Signed)
? ?Subjective:  ? ? Patient ID: Keith Mills, male    DOB: 09-02-1971, 51 y.o.   MRN: [id number] ? ?HPI ? ?Keith Mills is a very pleasant 51 y.o. male with a history of hypertension, small bowel obstruction with partial colectomy, weak urinary stream who presents today for follow up of chronic conditions. ? ?1) Essential Hypertension: Currently managed on amlodipine 5 mg daily. He is checking his BP at home on occasion which runs 130's/90's. He denies chest pain, dizziness, headaches.  ? ?BP Readings from Last 3 Encounters:  ?12/31/21 132/82  ?09/03/21 117/71  ?08/21/21 120/73  ? ?2) Weak Urinary Stream/Nocturia: Currently managed on tamsulosin 0.4 mg daily. Symptoms have overall improved, but he does continue to notice incomplete bladder empty and nocturia.  ? ?He is getting up 2 times nightly on average to urinate.  He is compliant to tamsulosin 0.4 mg daily, his wondering today if there are nonmedication options for treatment.  He has not seen urology. ? ?3) Small Bowel Obstruction/Partial Colectomy: Initially evaluated by GI and July 2022 for colonoscopy.  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  ? ?Referred to general surgery in October/November 2022, underwent robotic left colectomy.  Complications for AKI.  Discharged home 3 days later.  ? ?In December 2022, admitted for small bowel obstruction secondary to adhesions from recent surgery.  Hospitalized for several days, underwent robotic laparoscopic lysis of adhesions.  Referred to nephrology for CKD. ? ?Since his last admission he is doing well.  He denies abdominal pain, constipation.  He is passing gas.  He has had no follow-up labs since his hospitalization.  He has not connected with the nephrologist, plans on doing so. ? ?Review of Systems  ?Eyes:  Negative for visual disturbance.  ?Respiratory:  Negative  for shortness of breath.   ?Cardiovascular:  Negative for chest pain.  ?Gastrointestinal:  Negative for abdominal pain, blood in stool and constipation.  ?Neurological:  Negative for dizziness and headaches.  ? ?   ? ? ?Past Medical History:  ?Diagnosis Date  ? Hypertension   ? ? ?Social History  ? ?Socioeconomic History  ? Marital status: Married  ?  Spouse name: Not on file  ? Number of children: Not on file  ? Years of education: Not on file  ? Highest education level: Not on file  ?Occupational History  ? Not on file  ?Tobacco Use  ? Smoking status: Never  ? Smokeless tobacco: Never  ?Vaping Use  ? Vaping Use: Never used  ?Substance and Sexual Activity  ? Alcohol use: Yes  ?  Alcohol/week: 1.0 - 2.0 standard drink  ?  Types: 1 - 2 Cans of beer per week  ?  Comment: moderate  ? Drug use: Never  ? Sexual activity: Not on file  ?Other Topics Concern  ? Not on file  ?Social History Narrative  ? Married.  ? 2 children.  ? Works as a Armed forces operational officer.  ? Enjoys playing sports with his children, exercising.   ? ?Social Determinants of Health  ? ?Financial Resource Strain: Not on file  ?Food Insecurity: No Food Insecurity  ? Worried About Charity fundraiser in the Last Year: Never true  ? Ran Out of Food in the Last Year: Never true  ?Transportation Needs: No Transportation Needs  ? Lack of Transportation (Medical): No  ?  Lack of Transportation (Non-Medical): No  ?Physical Activity: Not on file  ?Stress: Not on file  ?Social Connections: Not on file  ?Intimate Partner Violence: Not on file  ? ? ?Past Surgical History:  ?Procedure Laterality Date  ? COLONOSCOPY N/A 04/08/2021  ? Procedure: COLONOSCOPY;  Surgeon: Jonathon Bellows, MD;  [facility] ENDOSCOPY;  Service: Gastroenterology;  Laterality: N/A;  ? ? ?Family History  ?Problem Relation Age of Onset  ? Kidney failure Mother   ? Hypertension Mother   ? Breast cancer Mother   ? Prostate cancer Father 66  ? Heart disease Father   ? Hypertension Sister   ? Colon polyps  Sister   ? ? ?No Known Allergies ? ?Current Outpatient Medications on File Prior to Visit  ?Medication Sig Dispense Refill  ? amLODipine (NORVASC) 5 MG tablet Take 1 tablet (5 mg total) by mouth daily. For blood pressure. 90 tablet 2  ? Multiple Vitamin (MULTIVITAMIN ADULT PO) Take by mouth.    ? ondansetron (ZOFRAN) 4 MG tablet Take 1 tablet (4 mg total) by mouth every 8 (eight) hours as needed for nausea or vomiting. 20 tablet 0  ? tamsulosin (FLOMAX) 0.4 MG CAPS capsule TAKE 1 CAPSULE BY MOUTH ONCE DAILY FOR URINARY STREAM 90 capsule 2  ? ?No current facility-administered medications on file prior to visit.  ? ? ?BP 132/82 (BP Location: Left Arm, Patient Position: Sitting, Cuff Size: Normal)   Pulse 78   Temp (!) 97.3 ?F (36.3 ?C) (Temporal)   Ht '5\' 8"'$  (1.727 m)   Wt 234 lb (106.1 kg)   SpO2 97%   BMI 35.58 kg/m?  ?Objective:  ? Physical Exam ?Cardiovascular:  ?   Rate and Rhythm: Normal rate and regular rhythm.  ?Pulmonary:  ?   Effort: Pulmonary effort is normal.  ?   Breath sounds: Normal breath sounds. No wheezing or rales.  ?Abdominal:  ?   General: Bowel sounds are normal.  ?   Palpations: Abdomen is soft.  ?   Tenderness: There is no abdominal tenderness.  ?Musculoskeletal:  ?   Cervical back: Neck supple.  ?Skin: ?   General: Skin is warm and dry.  ?Neurological:  ?   Mental Status: He is alert and oriented to person, place, and time.  ? ? ? ? ? ?   ?Assessment & Plan:  ? ? ? ? ?This visit occurred during the SARS-CoV-2 public health emergency.  Safety protocols were in place, including screening questions prior to the visit, additional usage of staff PPE, and extensive cleaning of exam room while observing appropriate contact time as indicated for disinfecting solutions.  ?

## 2022-01-02 ENCOUNTER — Telehealth: Payer: Self-pay

## 2022-01-02 ENCOUNTER — Telehealth: Payer: 59

## 2022-01-02 NOTE — Telephone Encounter (Signed)
?  Care Management  ? ?Follow Up Note ? ? ?01/02/2022 ?Name: Keith Mills MRN: [id number] DOB: 1971-06-06 ? ? ?Referred by: Pleas Koch, NP ?Reason for referral : Care Coordination ? ? ?A second unsuccessful telephone outreach was attempted today. The patient was referred to the case management team for assistance with care management and care coordination.  Unable to leave voice message due to mailbox being full.  ? ?Follow Up Plan: The care management team will reach out to the patient again over the next 2 weeks.  ? ?Quinn Plowman RN,BSN,CCM ?RN Case Manager ?[place]  ?[phone] ? ? ?

## 2022-01-02 NOTE — Telephone Encounter (Signed)
Tried to call pt his vm was full , calling regarding lab results ?

## 2022-01-02 NOTE — Telephone Encounter (Signed)
-----   Message from Pleas Koch, NP sent at 01/01/2022  5:01 PM EDT ----- ?Please call patient: I am not sure if he uses MyChart. ? ?Kidney function looks better, but is overall chronically lower than normal. ?I recommend he schedule an appoint with the nephrologist as recommended ? ?Blood sugars are still in the prediabetic range, about the same as last time.  Needs to work on weight loss through diet and exercise. ? ?Prostate, cholesterol look good. ?

## 2022-01-07 ENCOUNTER — Telehealth: Payer: Self-pay

## 2022-01-07 IMAGING — DX DG ABDOMEN 1V
1 series · 1 of 1 positions shown · non-contrast
Comparison: None.

CLINICAL DATA: Nasogastric tube placement

EXAM:
ABDOMEN - 1 VIEW

[abdomen supine]
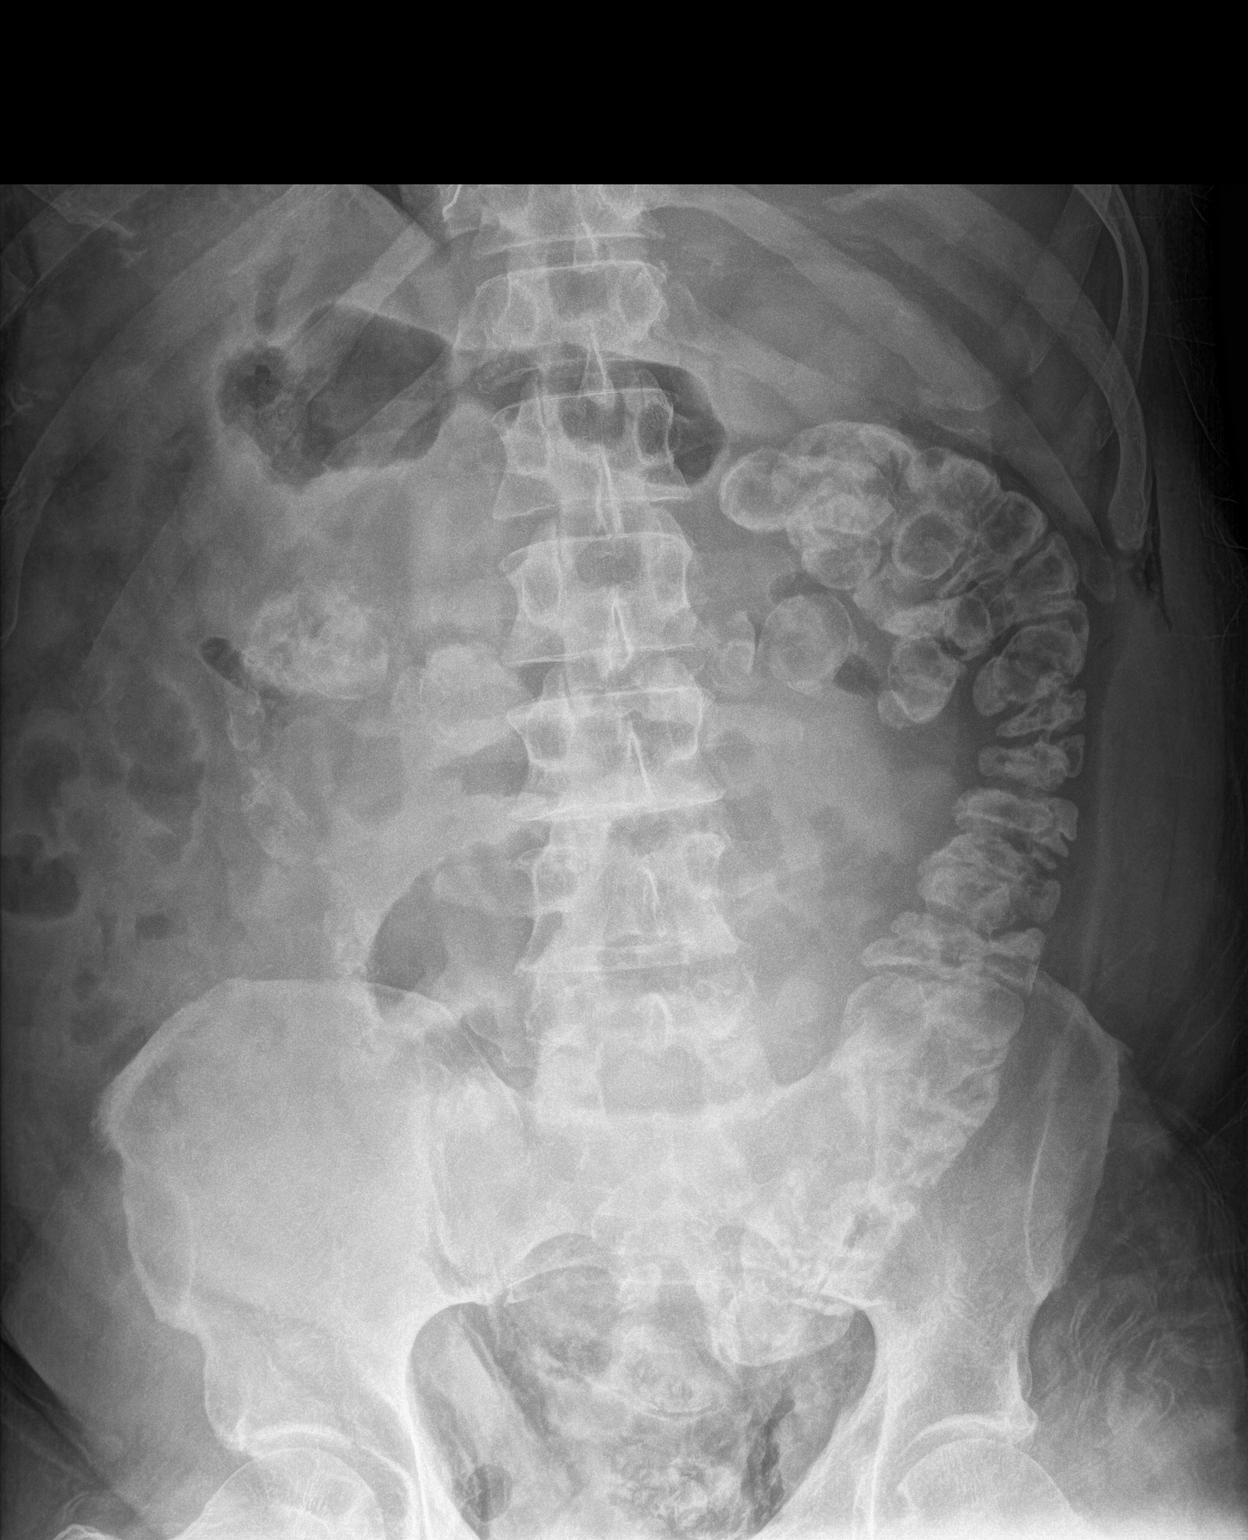

[1 of 1 positions shown; findings below may reference images not displayed]

FINDINGS: Tip of the nasogastric tube overlies the expected distal esophagus
proximal to the gastroesophageal junction. Normal abdominal gas
pattern. Contrast from prior CT examination is seen throughout the
colon.
IMPRESSION: Nasogastric tube within the distal esophagus. Advancement of the
catheter by 20 cm would be helpful for optimal positioning.

## 2022-01-07 NOTE — Telephone Encounter (Signed)
-----   Message from Pleas Koch, NP sent at 01/01/2022  5:01 PM EDT ----- ?Please call patient: I am not sure if he uses MyChart. ? ?Kidney function looks better, but is overall chronically lower than normal. ?I recommend he schedule an appoint with the nephrologist as recommended ? ?Blood sugars are still in the prediabetic range, about the same as last time.  Needs to work on weight loss through diet and exercise. ? ?Prostate, cholesterol look good. ?

## 2022-01-07 NOTE — Telephone Encounter (Signed)
Called and lvm for patient to call us back regarding his lab results. ?

## 2022-01-08 ENCOUNTER — Telehealth: Payer: 59

## 2022-01-08 ENCOUNTER — Telehealth: Payer: Self-pay

## 2022-01-08 IMAGING — CR DG ABD PORTABLE 2V
3 series · 3 of 3 positions shown · non-contrast
Comparison: 10/20/2020

CLINICAL DATA: Ileus after diagnostic laparoscopy.

EXAM:
PORTABLE ABDOMEN - 2 VIEW

[abdomen erect]
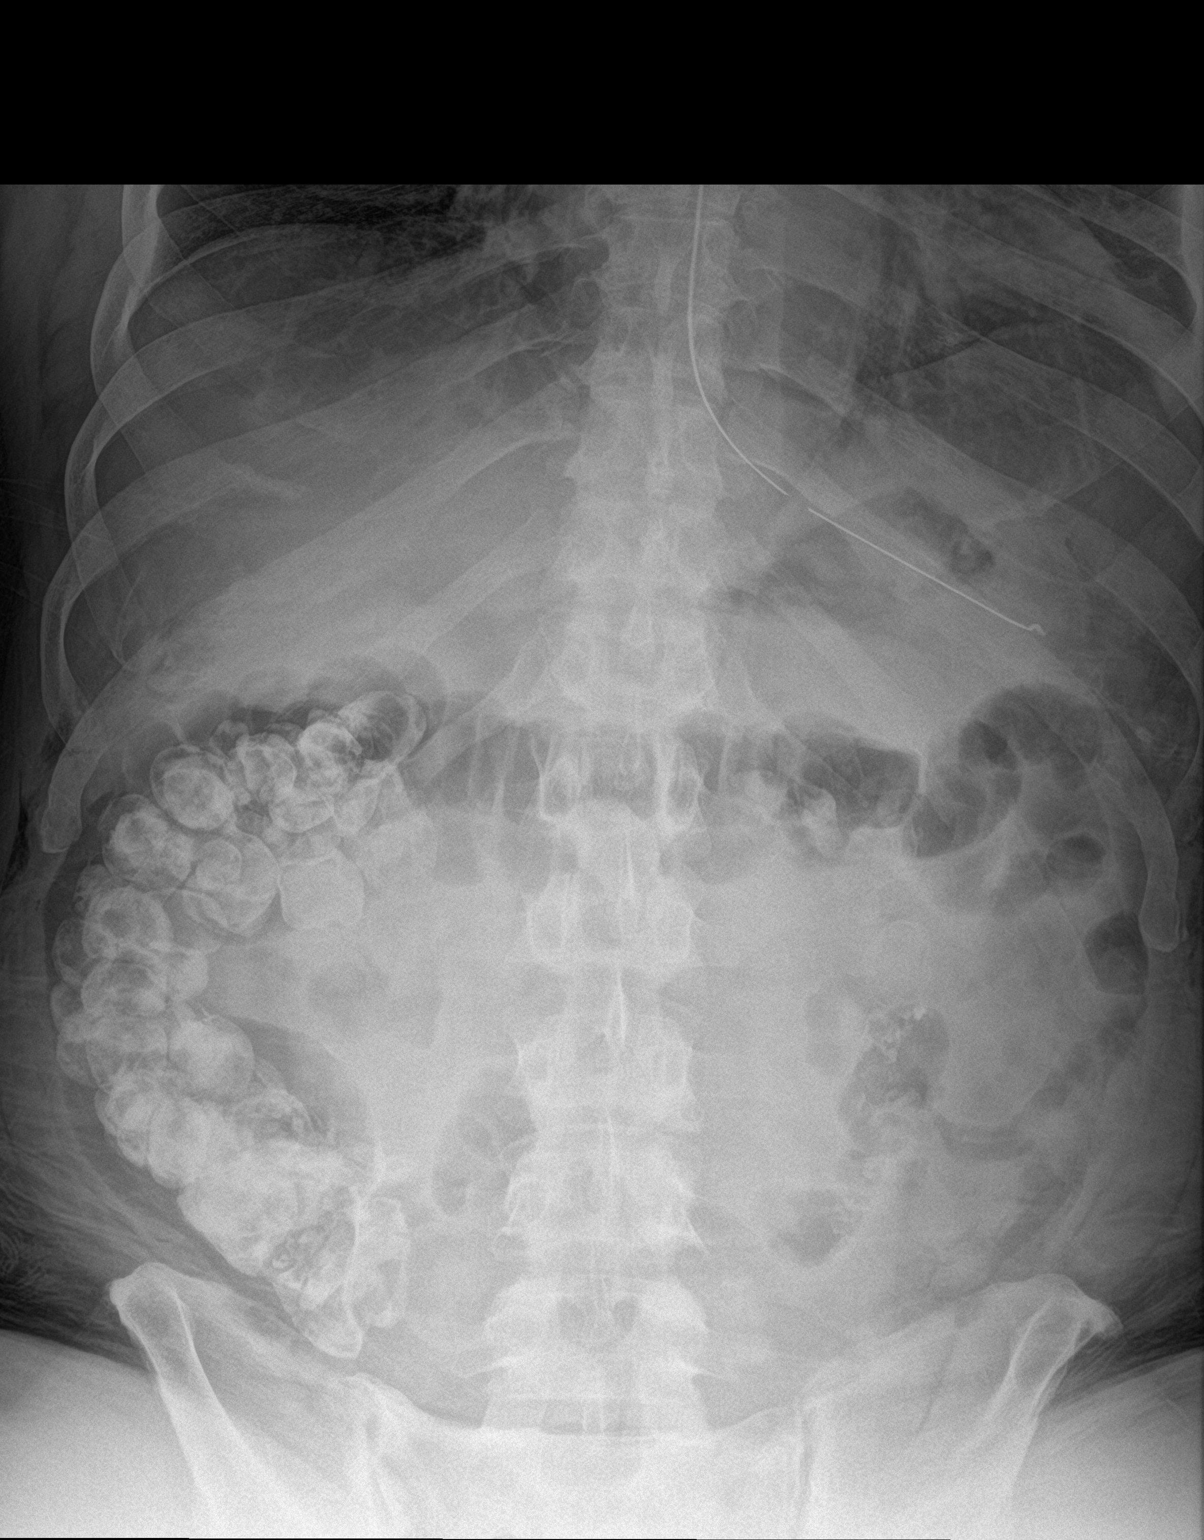

[abdomen supine (1 of 2)]
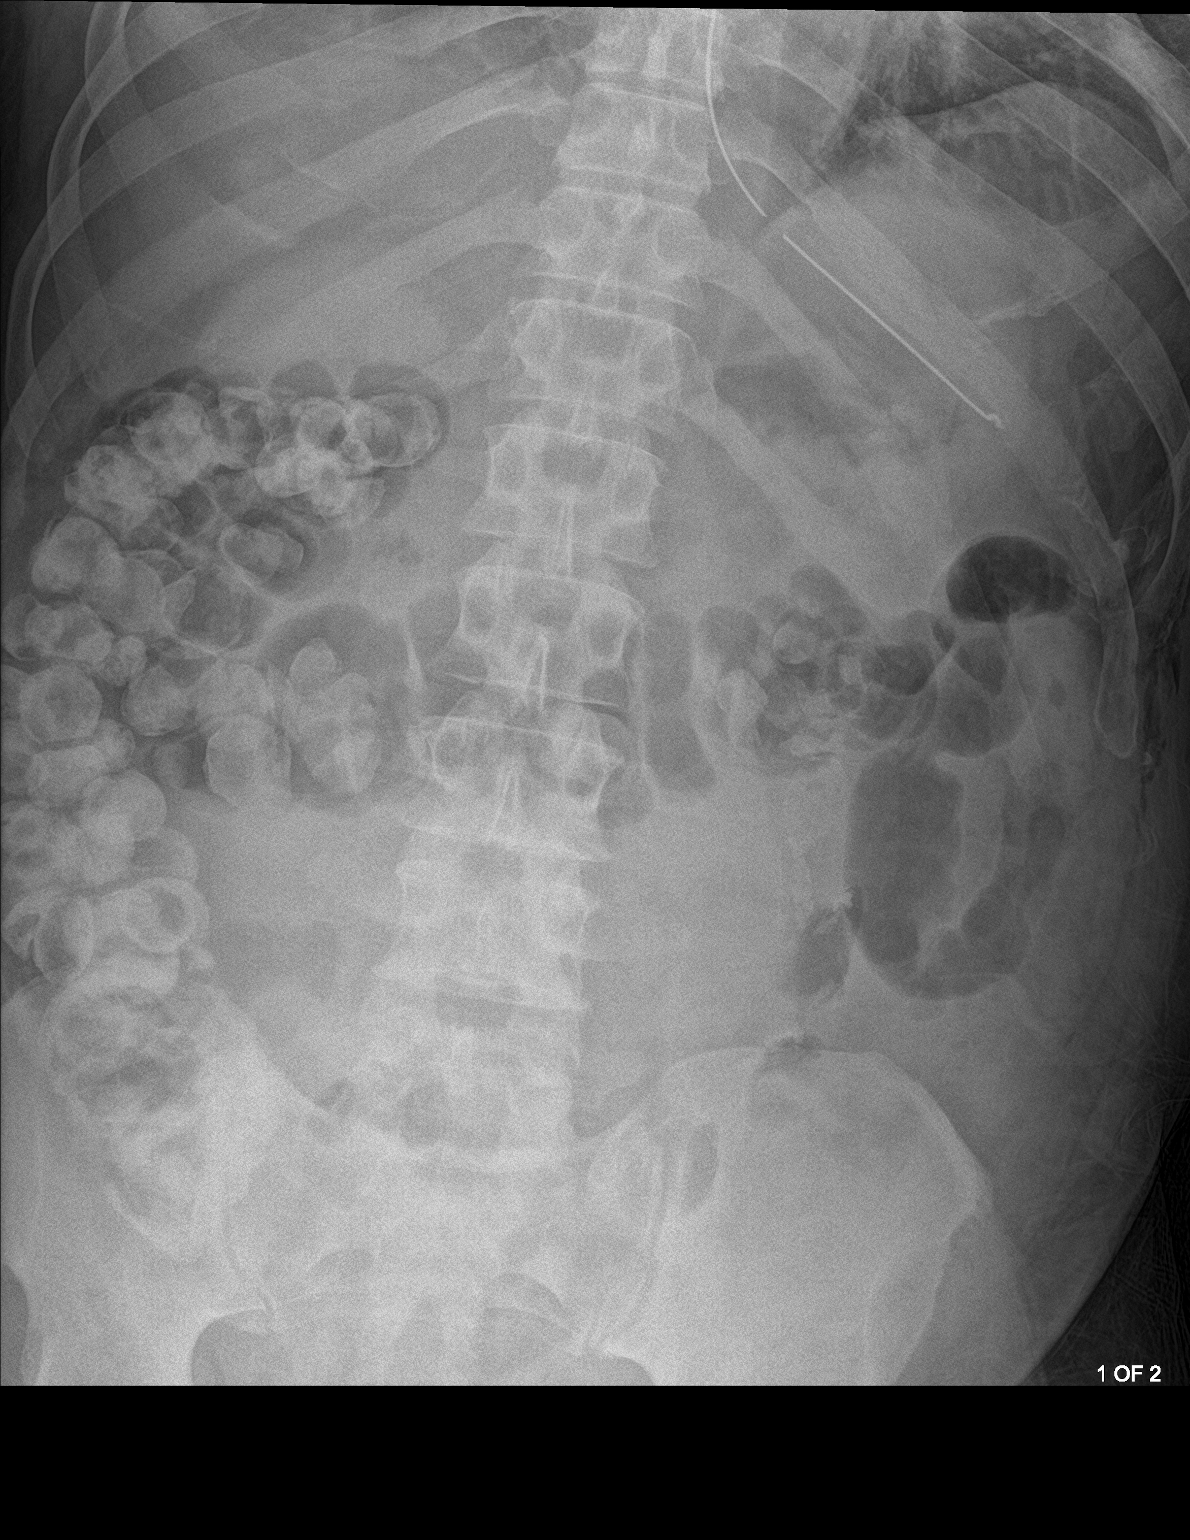

[abdomen supine (2 of 2)]
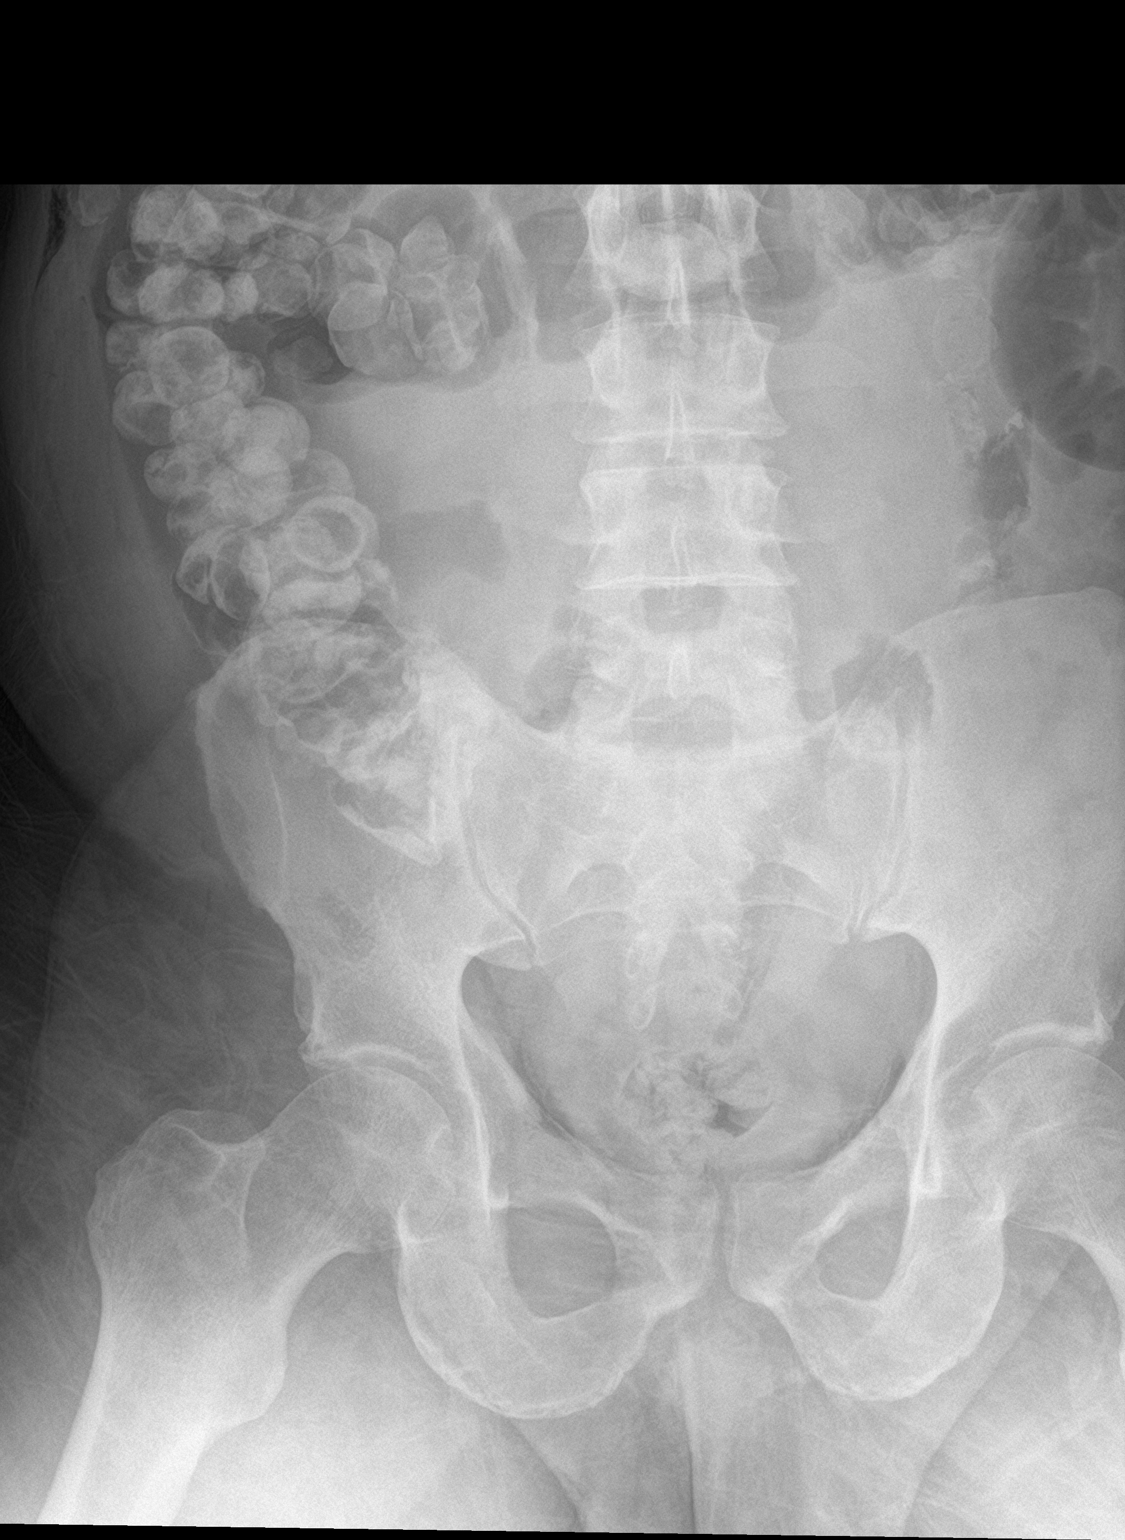

[3 of 3 positions shown; findings below may reference images not displayed]

FINDINGS: Interval decrease in gaseous small bowel distension. NG tube tip is
in the mid stomach with proximal side port in the region of the GE
junction. Mild distention of the right and transverse colon.
Opacified stool is noted in the right colon as before.
IMPRESSION: 1. Interval decrease in gaseous small bowel distension.
2. NG tube tip is in the mid stomach with proximal side port in the
region of the GE junction.

## 2022-01-08 NOTE — Telephone Encounter (Signed)
?  Care Management  ? ?Follow Up Note ? ? ?01/08/2022 ?Name: Keith Mills MRN: [id number] DOB: [birth year] ? ? ?Referred by: Pleas Koch, NP ?Reason for referral : Care Coordination ? ? ?Third unsuccessful telephone outreach was attempted today. The patient was referred to the case management team for assistance with care management and care coordination. The patient's primary care provider has been notified of our unsuccessful attempts to make or maintain contact with the patient. The care management team is pleased to engage with this patient at any time in the future should he/she be interested in assistance from the care management team.  ? ?Follow Up Plan: RNCM will make final telephone outreach attempt within 2-3 months ? ?Quinn Plowman RN,BSN,CCM ?RN Case Manager ?[place]  ?[phone] ? ?

## 2022-01-09 ENCOUNTER — Telehealth: Payer: Self-pay | Admitting: Nurse Practitioner

## 2022-01-09 DIAGNOSIS — N1831 Chronic kidney disease, stage 3a: Secondary | ICD-10-CM

## 2022-01-09 NOTE — Telephone Encounter (Signed)
Patient states that he did not see Dr. Candiss Norse and he would like a referral to be placed. ?

## 2022-01-09 NOTE — Telephone Encounter (Signed)
-----   Message from Francella Solian, [place] sent at 01/08/2022  4:10 PM EDT ----- ?Called patient reviewed all information and repeated back to me. Will call if any questions.  ? ?Patient would like to have referral sent to [facility] for nephrologist. Will work on diet.  ? ?Aware Anda Kraft is out of the office this week.  ?

## 2022-01-09 NOTE — Telephone Encounter (Signed)
I see that he was referred back in 07/2021 to Dr. Candiss Norse. Did he ever see him? If he has just make an appointment if not I can place a new referral ?

## 2022-01-09 NOTE — Telephone Encounter (Signed)
Referral sent 

## 2022-01-09 NOTE — Telephone Encounter (Signed)
-----   Message from Francella Solian, [place] sent at 01/08/2022  4:10 PM EDT ----- ?Called patient reviewed all information and repeated back to me. Will call if any questions.  ? ?Patient would like to have referral sent to [facility] for nephrologist. Will work on diet.  ? ?Aware Anda Kraft is out of the office this week.  ?

## 2022-01-09 NOTE — Addendum Note (Signed)
Addended by: Michela Pitcher on: 01/09/2022 01:28 PM ? ? Modules accepted: Orders ? ?

## 2022-01-09 NOTE — Telephone Encounter (Signed)
Referral placed.

## 2022-01-16 ENCOUNTER — Ambulatory Visit (INDEPENDENT_AMBULATORY_CARE_PROVIDER_SITE_OTHER): Payer: 59 | Admitting: Urology

## 2022-01-16 ENCOUNTER — Encounter: Payer: Self-pay | Admitting: Urology

## 2022-01-16 VITALS — BP 134/84 | HR 76 | Ht 68.0 in | Wt 229.0 lb

## 2022-01-16 DIAGNOSIS — Z8042 Family history of malignant neoplasm of prostate: Secondary | ICD-10-CM

## 2022-01-16 DIAGNOSIS — R3912 Poor urinary stream: Secondary | ICD-10-CM

## 2022-01-16 DIAGNOSIS — N401 Enlarged prostate with lower urinary tract symptoms: Secondary | ICD-10-CM

## 2022-01-16 DIAGNOSIS — R3914 Feeling of incomplete bladder emptying: Secondary | ICD-10-CM

## 2022-01-16 LAB — URINALYSIS, COMPLETE
Bilirubin, UA: NEGATIVE
Glucose, UA: NEGATIVE
Ketones, UA: NEGATIVE
Leukocytes,UA: NEGATIVE
Nitrite, UA: NEGATIVE
Protein,UA: NEGATIVE
Specific Gravity, UA: >1.03 — ABNORMAL HIGH (ref 1.005–1.030)
Urobilinogen, Ur: 0.2 mg/dL (ref 0.2–1.0)
pH, UA: 6 (ref 5.0–7.5)

## 2022-01-16 LAB — MICROSCOPIC EXAMINATION: Bacteria, UA: NONE SEEN

## 2022-01-16 LAB — BLADDER SCAN AMB NON-IMAGING: Scan Result: 2

## 2022-01-16 NOTE — Patient Instructions (Signed)
Finasteride, Dutasteride ? ?Rezum (vapor procedure) ?

## 2022-01-16 NOTE — Progress Notes (Signed)
? ?01/16/2022 ?8:46 AM  ? ?[name] ?22-Mar-1971 ?[id number] ? ?Referring provider: Pleas Koch, NP ?[address] ?[place],  [place] [address] ? ?Chief Complaint  ?Patient presents with  ? weak urinary stream  ? ? ?HPI: ?Keith Mills is a 51 y.o. male referred for evaluation of lower urinary tract symptoms. ? ?~ 1 year ago he developed bothersome lower urinary tract symptoms including weak urinary stream, sensation incomplete emptying, urgency and nocturia x3-4 ?Was started on tamsulosin with significant improvement in his symptoms ?He completed an IPSS today which was 12/35 but states this would be if he was not taking the tamsulosin and with tamsulosin his symptoms are minimal.  He does have nocturia x2 ?He requested urology appointment to discuss alternatives so he would not have to take medication ?He does have a family history of prostate cancer; last PSA 12/2021 was 0.75 ?No dysuria or gross hematuria.  No flank, abdominal or pelvic pain ? ? ?PMH: ?Past Medical History:  ?Diagnosis Date  ? Hypertension   ? SBO (small bowel obstruction) ([place]) 08/19/2021  ? ? ?Surgical History: ?Past Surgical History:  ?Procedure Laterality Date  ? COLONOSCOPY N/A 04/08/2021  ? Procedure: COLONOSCOPY;  Surgeon: Jonathon Bellows, MD;  Location: [facility] ENDOSCOPY;  Service: Gastroenterology;  Laterality: N/A;  ? ? ?Home Medications:  ?Allergies as of 01/16/2022   ?No Known Allergies ?  ? ?  ?Medication List  ?  ? ?  ? Accurate as of Jan 16, 2022  8:46 AM. If you have any questions, ask your nurse or doctor.  ?  ?  ? ?  ? ?amLODipine 5 MG tablet ?Commonly known as: NORVASC ?Take 1 tablet (5 mg total) by mouth daily for blood pressure. ?(Take 1 tablet (5 mg total) by mouth daily. For blood pressure.) ?  ?MULTIVITAMIN ADULT PO ?Take by mouth. ?  ?ondansetron 4 MG tablet ?Commonly known as: Zofran ?Take 1 tablet (4 mg total) by mouth every 8 (eight) hours as needed for nausea or vomiting. ?  ?tamsulosin 0.4 MG Caps capsule ?Commonly  known as: FLOMAX ?TAKE 1 CAPSULE BY MOUTH ONCE DAILY FOR URINARY STREAM ?  ? ?  ? ? ?Allergies: No Known Allergies ? ?Family History: ?Family History  ?Problem Relation Age of Onset  ? Kidney failure Mother   ? Hypertension Mother   ? Breast cancer Mother   ? Prostate cancer Father 75  ? Heart disease Father   ? Hypertension Sister   ? Colon polyps Sister   ? ? ?Social History:  reports that he has never smoked. He has never used smokeless tobacco. He reports current alcohol use of about 1.0 - 2.0 standard drink per week. He reports that he does not use drugs. ? ? ?Physical Exam: ?BP 134/84   Pulse 76   Ht '5\' 8"'$  (1.727 m)   Wt 229 lb (103.9 kg)   BMI 34.82 kg/m?   ?Constitutional:  Alert and oriented, No acute distress. ?HEENT: [place] AT, moist mucus membranes.  Trachea midline ?Respiratory: Normal respiratory effort, no increased work of breathing. ?Psychiatric: Normal mood and affect. ? ?Laboratory Data: ? ?Lab Results  ?Component Value Date  ? PSA 0.75 12/31/2021  ? PSA 1.75 10/22/2020  ? PSA 0.74 03/08/2020  ? ? ?Urinalysis ?Dipstick trace blood ?Microscopy negative ? ? ?Assessment & Plan:   ? ?1.  Lower urinary tract symptoms ?We discussed his voiding symptoms are most likely secondary to BPH or increased bladder neck tone ?Symptoms significantly  improved on tamsulosin ?Surgical alternatives were discussed including minimally invasive options of UroLift and Rezum.  TURP was also discussed ?He also asked about medications that shrink the prostate and 5-ARI medications were also discussed ?PVR today 2 mL ?He was provided literature on UroLift.  None of our providers perform Rezum but it is available in [place] ?If interested in proceeding with UroLift we discussed he would need an office flexible cystoscopy and transrectal ultrasound of the prostate for volume to see if he is a candidate ?He will call back if he elects to pursue ? ? ?Abbie Sons, MD ? ?[place] ?[address] ?[place], [place] [address] ?[phone] ? ?

## 2022-02-20 ENCOUNTER — Other Ambulatory Visit (HOSPITAL_COMMUNITY): Payer: Self-pay

## 2022-02-24 ENCOUNTER — Other Ambulatory Visit (HOSPITAL_COMMUNITY): Payer: Self-pay

## 2022-02-26 DIAGNOSIS — N400 Enlarged prostate without lower urinary tract symptoms: Secondary | ICD-10-CM | POA: Diagnosis not present

## 2022-02-26 DIAGNOSIS — Z8719 Personal history of other diseases of the digestive system: Secondary | ICD-10-CM | POA: Diagnosis not present

## 2022-02-26 DIAGNOSIS — Z9049 Acquired absence of other specified parts of digestive tract: Secondary | ICD-10-CM | POA: Diagnosis not present

## 2022-02-26 DIAGNOSIS — N1831 Chronic kidney disease, stage 3a: Secondary | ICD-10-CM | POA: Diagnosis not present

## 2022-02-26 DIAGNOSIS — I10 Essential (primary) hypertension: Secondary | ICD-10-CM | POA: Diagnosis not present

## 2022-03-03 ENCOUNTER — Other Ambulatory Visit (HOSPITAL_COMMUNITY): Payer: Self-pay

## 2022-03-10 ENCOUNTER — Telehealth: Payer: 59

## 2022-03-10 ENCOUNTER — Ambulatory Visit: Payer: Self-pay

## 2022-03-10 NOTE — Chronic Care Management (AMB) (Deleted)
Chronic Care Management   CCM RN Visit Note  03/10/2022 Name: Keith Mills MRN: [id number] DOB: 1970/11/01  Subjective: Keith Mills is a 51 y.o. year old male who is a primary care patient of Doreene Nest, NP. The care management team was consulted for assistance with disease management and care coordination needs.     follow up visit in response to provider referral for case management and/or care coordination services.   Consent to Services:  The patient was given information about Chronic Care Management services, agreed to services, and gave verbal consent prior to initiation of services.  Please see initial visit note for detailed documentation.   Patient agreed to services and verbal consent obtained.   Assessment: Review of patient past medical history, allergies, medications, health status, including review of consultants reports, laboratory and other test data, was performed as part of comprehensive evaluation and provision of chronic care management services.   SDOH (Social Determinants of Health) assessments and interventions performed:    CCM Care Plan  No Known Allergies  Outpatient Encounter Medications as of 03/10/2022  Medication Sig   amLODipine (NORVASC) 5 MG tablet Take 1 tablet (5 mg total) by mouth daily. For blood pressure.   Multiple Vitamin (MULTIVITAMIN ADULT PO) Take by mouth.   ondansetron (ZOFRAN) 4 MG tablet Take 1 tablet (4 mg total) by mouth every 8 (eight) hours as needed for nausea or vomiting.   tamsulosin (FLOMAX) 0.4 MG CAPS capsule TAKE 1 CAPSULE BY MOUTH ONCE DAILY FOR URINARY STREAM   No facility-administered encounter medications on file as of 03/10/2022.    Patient Active Problem List   Diagnosis Date Noted   CKD (chronic kidney disease) stage 3, GFR 30-59 ml/min (HCC) 12/31/2021   Prediabetes 12/31/2021   S/P partial colectomy 07/17/2021   Weak urinary stream 10/22/2020   Essential hypertension 03/08/2020   Preventative health  care 03/07/2019   Hematospermia 12/14/2013   Hydrocele 12/14/2013    Conditions to be addressed/monitored:HTN  Care Plan : [facility] Plan of care  Updates made by Otho Ket, RN since 03/10/2022 12:00 AM     Problem: Chronic disease management education and / or care coordination needs.   Priority: High     Long-Range Goal: Development of plan of care to address chronic disease management and/ or care coordination needs.   Start Date: 10/30/2021  Expected End Date: 02/11/2022  Priority: High  Note:   Unable to re-establish contact with patient.  Case closed.  Current Barriers:  Knowledge Deficits related to plan of care for management of HTN  RNCM Clinical Goal(s):  Patient will verbalize understanding of plan for management of HTN as evidenced by patient self report and/ or notation in chart take all medications exactly as prescribed and will call provider for medication related questions as evidenced by patient self report and /or notation chart.     attend all scheduled medical appointments:   as evidenced by patient self report and/ or notation in chart         continue to work with RN Care Manager and/or Social Worker to address care management and care coordination needs related to HTN as evidenced by adherence to CM Team Scheduled appointments     through collaboration with RN Care manager, provider, and care team.   Interventions: 1:1 collaboration with primary care provider regarding development and update of comprehensive plan of care as evidenced by provider attestation and co-signature Inter-disciplinary care team collaboration (see longitudinal plan of  care) Evaluation of current treatment plan related to  self management and patient's adherence to plan as established by provider   Hypertension Interventions:  (Status:  unable to re-establish contact with patient.  Last practice recorded BP readings:  BP Readings from Last 3 Encounters:  09/03/21 117/71  08/21/21  120/73  08/18/21 121/85  Most recent eGFR/CrCl: No results found for: EGFR  No components found for: CRCL  Evaluation of current treatment plan related to hypertension self management and patient's adherence to plan as established by provider Reviewed medications with patient and discussed importance of compliance Discussed plans with patient for ongoing care management follow up and provided patient with direct contact information for care management team Reviewed scheduled/upcoming provider appointments  Advised to check blood pressure 2-3 times per month and record.  Advised to follow low salt diet.   Patient Goals/Self-Care Activities: Take medications as prescribed   Attend all scheduled provider appointments Call pharmacy for medication refills 3-7 days in advance of running out of medications Call provider office for new concerns or questions  Monitor blood pressure 2-3 times per month Follow low salt diet. ( See education article sent to you in Mychart on low sodium diet. )        Plan:No further follow up required: Unable to re-establish contact with patient.  George Ina RN,BSN,CCM RN Case Manager [facility] [place]  [phone]

## 2022-03-13 NOTE — Progress Notes (Signed)
This encounter was created in error - please disregard.

## 2022-03-19 ENCOUNTER — Other Ambulatory Visit: Payer: Self-pay | Admitting: Primary Care

## 2022-03-19 ENCOUNTER — Other Ambulatory Visit (HOSPITAL_COMMUNITY): Payer: Self-pay

## 2022-03-19 DIAGNOSIS — I10 Essential (primary) hypertension: Secondary | ICD-10-CM

## 2022-03-19 DIAGNOSIS — R3912 Poor urinary stream: Secondary | ICD-10-CM

## 2022-03-20 ENCOUNTER — Other Ambulatory Visit: Payer: Self-pay

## 2022-03-20 MED ORDER — TAMSULOSIN HCL 0.4 MG PO CAPS
ORAL_CAPSULE | ORAL | 2 refills | Status: DC
  Filled 2022-03-20: qty 90, 90d supply, fill #0
  Filled 2022-06-17: qty 90, 90d supply, fill #1
  Filled 2022-09-16: qty 90, 90d supply, fill #2

## 2022-03-20 MED ORDER — AMLODIPINE BESYLATE 5 MG PO TABS
5.0000 mg | ORAL_TABLET | Freq: Every day | ORAL | 2 refills | Status: DC
  Filled 2022-03-20: qty 90, 90d supply, fill #0
  Filled 2022-06-17: qty 90, 90d supply, fill #1
  Filled 2022-09-16: qty 90, 90d supply, fill #2

## 2022-06-17 ENCOUNTER — Other Ambulatory Visit: Payer: Self-pay

## 2022-07-23 ENCOUNTER — Telehealth: Payer: Self-pay | Admitting: *Deleted

## 2022-07-23 DIAGNOSIS — Z86010 Personal history of colonic polyps: Secondary | ICD-10-CM

## 2022-07-23 DIAGNOSIS — D126 Benign neoplasm of colon, unspecified: Secondary | ICD-10-CM

## 2022-07-23 NOTE — Telephone Encounter (Signed)
Patient called office and he was very frantic about scheduling a colonoscopy by the beginning of year.  He stated that he was told by surgeon that he should repeat his colonoscopy from last year.  His colonoscopy was done 04/08/2021.  Please advise and give the okay.  Thank you

## 2022-07-27 ENCOUNTER — Other Ambulatory Visit: Payer: Self-pay | Admitting: *Deleted

## 2022-07-27 ENCOUNTER — Other Ambulatory Visit: Payer: Self-pay

## 2022-07-27 DIAGNOSIS — D126 Benign neoplasm of colon, unspecified: Secondary | ICD-10-CM

## 2022-07-27 DIAGNOSIS — Z860100 Personal history of colon polyps, unspecified: Secondary | ICD-10-CM

## 2022-07-27 MED ORDER — NA SULFATE-K SULFATE-MG SULF 17.5-3.13-1.6 GM/177ML PO SOLN
1.0000 | Freq: Once | ORAL | 0 refills | Status: DC
  Filled 2022-07-27: qty 354, fill #0

## 2022-07-27 MED ORDER — NA SULFATE-K SULFATE-MG SULF 17.5-3.13-1.6 GM/177ML PO SOLN
1.0000 | Freq: Once | ORAL | 0 refills | Status: AC
  Filled 2022-07-27 – 2022-08-18 (×2): qty 354, 1d supply, fill #0

## 2022-07-27 MED ORDER — NA SULFATE-K SULFATE-MG SULF 17.5-3.13-1.6 GM/177ML PO SOLN
1.0000 | Freq: Once | ORAL | 0 refills | Status: DC
  Filled 2022-07-27: qty 354, 1d supply, fill #0

## 2022-07-27 NOTE — Telephone Encounter (Addendum)
Voicemail message left for patient to return my call.  Per Dr Vicente Males, okay to schedule the colonoscopy per progress note on 05/20/2021.

## 2022-07-27 NOTE — Addendum Note (Signed)
Addended by: Jacqualin Combes on: 07/27/2022 04:32 PM   Modules accepted: Orders

## 2022-07-27 NOTE — Telephone Encounter (Signed)
Gastroenterology Pre-Procedure Review  Request Date: 08/19/2022 Requesting Physician: Dr. Anna  PATIENT REVIEW QUESTIONS: The patient responded to the following health history questions as indicated:    1. Are you having any GI issues? no 2. Do you have a personal history of Polyps? yes (04/08/2021) 3. Do you have a family history of Colon Cancer or Polyps? no 4. Diabetes Mellitus? no 5. Joint replacements in the past 12 months?no 6. Major health problems in the past 3 months?no 7. Any artificial heart valves, MVP, or defibrillator?no    MEDICATIONS & ALLERGIES:    Patient reports the following regarding taking any anticoagulation/antiplatelet therapy:   Plavix, Coumadin, Eliquis, Xarelto, Lovenox, Pradaxa, Brilinta, or Effient? no Aspirin? no  Patient confirms/reports the following medications:  Current Outpatient Medications  Medication Sig Dispense Refill   amLODipine (NORVASC) 5 MG tablet Take 1 tablet (5 mg total) by mouth daily. For blood pressure. 90 tablet 2   Multiple Vitamin (MULTIVITAMIN ADULT PO) Take by mouth.     Na Sulfate-K Sulfate-Mg Sulf 17.5-3.13-1.6 GM/177ML SOLN Take 1 kit by mouth once for 1 dose. 354 mL 0   ondansetron (ZOFRAN) 4 MG tablet Take 1 tablet (4 mg total) by mouth every 8 (eight) hours as needed for nausea or vomiting. 20 tablet 0   tamsulosin (FLOMAX) 0.4 MG CAPS capsule TAKE 1 CAPSULE BY MOUTH ONCE DAILY FOR URINARY STREAM 90 capsule 2   No current facility-administered medications for this visit.    Patient confirms/reports the following allergies:  No Known Allergies  Orders Placed This Encounter  Procedures   Ambulatory referral to Gastroenterology    Referral Priority:   Routine    Referral Type:   Consultation    Referral Reason:   Specialty Services Required    Referred to Provider:   Anna, Kiran, MD    Number of Visits Requested:   1    AUTHORIZATION INFORMATION Primary Insurance: 1D#: Group #:  Secondary  Insurance: 1D#: Group #:  SCHEDULE INFORMATION: Date: 08/19/2022 Time: Location: [facility]  

## 2022-08-11 ENCOUNTER — Other Ambulatory Visit: Payer: Self-pay

## 2022-08-18 ENCOUNTER — Encounter: Payer: Self-pay | Admitting: Gastroenterology

## 2022-08-18 ENCOUNTER — Other Ambulatory Visit: Payer: Self-pay

## 2022-08-19 ENCOUNTER — Ambulatory Visit: Payer: 59 | Admitting: Anesthesiology

## 2022-08-19 ENCOUNTER — Encounter: Payer: Self-pay | Admitting: Gastroenterology

## 2022-08-19 ENCOUNTER — Encounter
Admission: RE | Disposition: A | Discharge status: Home / Self Care | Payer: Self-pay | Source: Home / Self Care | Attending: Gastroenterology

## 2022-08-19 ENCOUNTER — Ambulatory Visit
Admission: RE | Admit: 2022-08-19 | Discharge: 2022-08-19 | Disposition: A | Discharge status: Home / Self Care | Payer: 59 | Attending: Gastroenterology | Admitting: Gastroenterology

## 2022-08-19 DIAGNOSIS — Z79899 Other long term (current) drug therapy: Secondary | ICD-10-CM | POA: Insufficient documentation

## 2022-08-19 DIAGNOSIS — E669 Obesity, unspecified: Secondary | ICD-10-CM | POA: Diagnosis not present

## 2022-08-19 DIAGNOSIS — D126 Benign neoplasm of colon, unspecified: Secondary | ICD-10-CM | POA: Diagnosis not present

## 2022-08-19 DIAGNOSIS — I10 Essential (primary) hypertension: Secondary | ICD-10-CM | POA: Diagnosis not present

## 2022-08-19 DIAGNOSIS — K640 First degree hemorrhoids: Secondary | ICD-10-CM | POA: Diagnosis not present

## 2022-08-19 DIAGNOSIS — Z980 Intestinal bypass and anastomosis status: Secondary | ICD-10-CM | POA: Insufficient documentation

## 2022-08-19 DIAGNOSIS — Z860100 Personal history of colon polyps, unspecified: Secondary | ICD-10-CM

## 2022-08-19 DIAGNOSIS — Z8719 Personal history of other diseases of the digestive system: Secondary | ICD-10-CM | POA: Insufficient documentation

## 2022-08-19 DIAGNOSIS — Z09 Encounter for follow-up examination after completed treatment for conditions other than malignant neoplasm: Secondary | ICD-10-CM | POA: Diagnosis present

## 2022-08-19 DIAGNOSIS — I129 Hypertensive chronic kidney disease with stage 1 through stage 4 chronic kidney disease, or unspecified chronic kidney disease: Secondary | ICD-10-CM | POA: Diagnosis not present

## 2022-08-19 DIAGNOSIS — K635 Polyp of colon: Secondary | ICD-10-CM | POA: Diagnosis not present

## 2022-08-19 DIAGNOSIS — D122 Benign neoplasm of ascending colon: Secondary | ICD-10-CM | POA: Diagnosis not present

## 2022-08-19 DIAGNOSIS — Z6833 Body mass index (BMI) 33.0-33.9, adult: Secondary | ICD-10-CM | POA: Diagnosis not present

## 2022-08-19 DIAGNOSIS — N183 Chronic kidney disease, stage 3 unspecified: Secondary | ICD-10-CM | POA: Diagnosis not present

## 2022-08-19 HISTORY — PX: COLONOSCOPY WITH PROPOFOL: SHX5780

## 2022-08-19 SURGERY — COLONOSCOPY WITH PROPOFOL
Anesthesia: General

## 2022-08-19 MED ORDER — SODIUM CHLORIDE 0.9 % IV SOLN
INTRAVENOUS | Status: DC
  Administered 2022-08-19: 1000 mL via INTRAVENOUS

## 2022-08-19 MED ORDER — STERILE WATER FOR IRRIGATION IR SOLN
Status: DC | PRN
  Administered 2022-08-19: 50 mL

## 2022-08-19 MED ORDER — PROPOFOL 10 MG/ML IV BOLUS
INTRAVENOUS | Status: DC | PRN
  Administered 2022-08-19: 120 mg via INTRAVENOUS
  Administered 2022-08-19: 50 mg via INTRAVENOUS
  Administered 2022-08-19 (×2): 40 mg via INTRAVENOUS
  Administered 2022-08-19: 50 mg via INTRAVENOUS

## 2022-08-19 NOTE — Op Note (Signed)
[facility] Gastroenterology Patient Name: Keith Mills Procedure Date: 08/19/2022 9:07 AM MRN: [id number] Account #: [id number] Date of Birth: 1971-08-16 Admit Type: Outpatient Age: 51 Room: [facility] ENDO ROOM 3 Gender: Male Note Status: Finalized Instrument Name: Park Meo [id number] Procedure:             Colonoscopy Indications:           High risk colon cancer surveillance: Personal history                         of adenoma with high grade dysplasia Providers:             Jonathon Bellows MD, MD Referring MD:          Pleas Koch (Referring MD) Medicines:             Monitored Anesthesia Care Complications:         No immediate complications. Procedure:             Pre-Anesthesia Assessment:                        - Prior to the procedure, a History and Physical was                         performed, and patient medications, allergies and                         sensitivities were reviewed. The patient's tolerance                         of previous anesthesia was reviewed.                        - The risks and benefits of the procedure and the                         sedation options and risks were discussed with the                         patient. All questions were answered and informed                         consent was obtained.                        - ASA Grade Assessment: II - A patient with mild                         systemic disease.                        After obtaining informed consent, the colonoscope was                         passed under direct vision. Throughout the procedure,                         the patient's blood pressure, pulse, and oxygen  saturations were monitored continuously. The                         Colonoscope was introduced through the anus and                         advanced to the the cecum, identified by the                         appendiceal orifice. The colonoscopy was performed                          with ease. The patient tolerated the procedure well.                         The quality of the bowel preparation was excellent.                         The ileocecal valve, appendiceal orifice, and rectum                         were photographed. Findings:      The perianal and digital rectal examinations were normal.      There was evidence of a prior end-to-side colo-colonic anastomosis in       the descending colon. This was patent and was characterized by healthy       appearing mucosa. The anastomosis was traversed.      A 5 mm polyp was found in the ascending colon. The polyp was sessile.       The polyp was removed with a cold snare. Resection and retrieval were       complete.      Non-bleeding internal hemorrhoids were found during retroflexion. The       hemorrhoids were medium-sized and Grade I (internal hemorrhoids that do       not prolapse). Impression:            - Patent end-to-side colo-colonic anastomosis,                         characterized by healthy appearing mucosa.                        - One 5 mm polyp in the sigmoid colon, removed with a                         cold snare. Resected and retrieved.                        - Non-bleeding internal hemorrhoids. Recommendation:        - Discharge patient to home (with escort).                        - Resume previous diet.                        - Continue present medications.                        - Await pathology results.                        -  Repeat colonoscopy in 3 years for surveillance. Procedure Code(s):     --- Professional ---                        [phone], Colonoscopy, flexible; with removal of                         tumor(s), polyp(s), or other lesion(s) by snare                         technique Diagnosis Code(s):     --- Professional ---                        Z86.010, Personal history of colonic polyps                        Z98.0, Intestinal bypass and anastomosis status                         D12.5, Benign neoplasm of sigmoid colon                        K64.0, First degree hemorrhoids CPT copyright 2022 American Medical Association. All rights reserved. The codes documented in this report are preliminary and upon coder review may  be revised to meet current compliance requirements. Jonathon Bellows, MD Jonathon Bellows MD, MD 08/19/2022 9:33:22 AM This report has been signed electronically. Number of Addenda: 0 Note Initiated On: 08/19/2022 9:07 AM Scope Withdrawal Time: 0 hours 8 minutes 11 seconds  Total Procedure Duration: 0 hours 9 minutes 27 seconds  Estimated Blood Loss:  Estimated blood loss: none.      [facility]

## 2022-08-19 NOTE — H&P (Signed)
Keith Bellows, MD [address] [place], [place], [place], [address] [address], [place], [place], [place], [address] Phone: [phone]  Fax: [phone]  Primary Care Physician:  Keith Koch, NP   Pre-Procedure History & Physical: HPI:  Keith Mills is a 51 y.o. male is here for an colonoscopy.   Past Medical History:  Diagnosis Date   Hypertension    SBO (small bowel obstruction) ([place]) 08/19/2021    Past Surgical History:  Procedure Laterality Date   COLON SURGERY     COLONOSCOPY N/A 04/08/2021   Procedure: COLONOSCOPY;  Surgeon: Keith Bellows, MD;  Location: [facility] ENDOSCOPY;  Service: Gastroenterology;  Laterality: N/A;    Prior to Admission medications   Medication Sig Start Date End Date Taking? Authorizing Provider  amLODipine (NORVASC) 5 MG tablet Take 1 tablet (5 mg total) by mouth daily. For blood pressure. 03/20/22  Yes Keith Koch, NP  Multiple Vitamin (MULTIVITAMIN ADULT PO) Take by mouth.   Yes [provider]  Na Sulfate-K Sulfate-Mg Sulf 17.5-3.13-1.6 GM/177ML SOLN Take 1 kit by mouth once for 1 dose. 07/27/22 08/19/22 Yes Keith Bellows, MD  tamsulosin (FLOMAX) 0.4 MG CAPS capsule TAKE 1 CAPSULE BY MOUTH ONCE DAILY FOR URINARY STREAM 03/20/22  Yes Keith Koch, NP  ondansetron (ZOFRAN) 4 MG tablet Take 1 tablet (4 mg total) by mouth every 8 (eight) hours as needed for nausea or vomiting. 08/15/21   Keith Husbands, MD    Allergies as of 07/27/2022   (No Known Allergies)    Family History  Problem Relation Age of Onset   Kidney failure Mother    Hypertension Mother    Breast cancer Mother    Prostate cancer Father 64   Heart disease Father    Hypertension Sister    Colon polyps Sister     Social History   Socioeconomic History   Marital status: Married    Spouse name: Not on file   Number of children: Not on file   Years of education: Not on file   Highest education level: Not on file  Occupational History   Not on file   Tobacco Use   Smoking status: Never   Smokeless tobacco: Never  Vaping Use   Vaping Use: Never used  Substance and Sexual Activity   Alcohol use: Yes    Alcohol/week: 1.0 - 2.0 standard drink of alcohol    Types: 1 - 2 Cans of beer per week    Comment: moderate   Drug use: Never   Sexual activity: Not on file  Other Topics Concern   Not on file  Social History Narrative   Married.   2 children.   Works as a Armed forces operational officer.   Enjoys playing sports with his children, exercising.    Social Determinants of Health   Financial Resource Strain: Not on file  Food Insecurity: No Food Insecurity (10/30/2021)   Hunger Vital Sign    Worried About Running Out of Food in the Last Year: Never true    Ran Out of Food in the Last Year: Never true  Transportation Needs: No Transportation Needs (10/30/2021)   PRAPARE - Hydrologist (Medical): No    Lack of Transportation (Non-Medical): No  Physical Activity: Not on file  Stress: Not on file  Social Connections: Not on file  Intimate Partner Violence: Not on file    Review of Systems: See HPI, otherwise negative ROS  Physical Exam: BP Marland Kitchen)  142/95   Pulse 70   Temp (!) 97.3 F (36.3 C) (Temporal)   Resp 18   Ht 5' 8" (1.727 m)   Wt 100.4 kg   SpO2 100%   BMI 33.67 kg/m  General:   Alert,  pleasant and cooperative in NAD Head:  Normocephalic and atraumatic. Neck:  Supple; no masses or thyromegaly. Lungs:  Clear throughout to auscultation, normal respiratory effort.    Heart:  +S1, +S2, Regular rate and rhythm, No edema. Abdomen:  Soft, nontender and nondistended. Normal bowel sounds, without guarding, and without rebound.   Neurologic:  Alert and  oriented x4;  grossly normal neurologically.  Impression/Plan: [name] is here for an colonoscopy to be performed for surveillance due to prior history of colon polyps   Risks, benefits, limitations, and alternatives regarding  colonoscopy have been  reviewed with the patient.  Questions have been answered.  All parties agreeable.   Keith Bellows, MD  08/19/2022, 9:08 AM

## 2022-08-19 NOTE — Transfer of Care (Signed)
Immediate Anesthesia Transfer of Care Note  Patient: Keith Mills  Procedure(s) Performed: COLONOSCOPY WITH PROPOFOL  Patient Location: Endoscopy Unit  Anesthesia Type:General  Level of Consciousness: drowsy  Airway & Oxygen Therapy: Patient Spontanous Breathing and Patient connected to nasal cannula oxygen  Post-op Assessment: Report given to RN, Post -op Vital signs reviewed and stable, and Patient moving all extremities  Post vital signs: Reviewed and stable  Last Vitals:  Vitals Value Taken Time  BP    Temp    Pulse    Resp    SpO2      Last Pain:  Vitals:   08/19/22 0855  TempSrc: Temporal  PainSc: 0-No pain         Complications: No notable events documented.

## 2022-08-19 NOTE — Anesthesia Preprocedure Evaluation (Signed)
Anesthesia Evaluation  Patient identified by MRN, date of birth, ID band Patient awake    Reviewed: Allergy & Precautions, NPO status , Patient's Chart, lab work & pertinent test results  Airway Mallampati: III  TM Distance: >3 FB Neck ROM: full    Dental  (+) Teeth Intact   Pulmonary neg pulmonary ROS   Pulmonary exam normal        Cardiovascular hypertension, Pt. on medications negative cardio ROS Normal cardiovascular exam     Neuro/Psych negative neurological ROS  negative psych ROS   GI/Hepatic negative GI ROS, Neg liver ROS,,,  Endo/Other  negative endocrine ROS    Renal/GU Renal diseasenegative Renal ROS  negative genitourinary   Musculoskeletal   Abdominal   Peds  Hematology negative hematology ROS (+)   Anesthesia Other Findings Past Medical History: No date: Hypertension 08/19/2021: SBO (small bowel obstruction) ([place])  Past Surgical History: 04/08/2021: COLONOSCOPY; N/A     Comment:  Procedure: COLONOSCOPY;  Surgeon: Jonathon Bellows, MD;                Location: [facility] ENDOSCOPY;  Service: Gastroenterology;                Laterality: N/A;     Reproductive/Obstetrics negative OB ROS                              Anesthesia Physical Anesthesia Plan  ASA: 2  Anesthesia Plan: General   Post-op Pain Management:    Induction: Intravenous  PONV Risk Score and Plan: Propofol infusion and TIVA  Airway Management Planned: Natural Airway and Nasal Cannula  Additional Equipment:   Intra-op Plan:   Post-operative Plan:   Informed Consent: I have reviewed the patients History and Physical, chart, labs and discussed the procedure including the risks, benefits and alternatives for the proposed anesthesia with the patient or authorized representative who has indicated his/her understanding and acceptance.     Dental Advisory Given  Plan Discussed with: Anesthesiologist, CRNA  and Surgeon  Anesthesia Plan Comments: (Patient consented for risks of anesthesia including but not limited to:  - adverse reactions to medications - risk of airway placement if required - damage to eyes, teeth, lips or other oral mucosa - nerve damage due to positioning  - sore throat or hoarseness - Damage to heart, brain, nerves, lungs, other parts of body or loss of life  Patient voiced understanding.)         Anesthesia Quick Evaluation

## 2022-08-19 NOTE — Anesthesia Postprocedure Evaluation (Signed)
Anesthesia Post Note  Patient: Keith Mills  Procedure(s) Performed: COLONOSCOPY WITH PROPOFOL  Patient location during evaluation: Endoscopy Anesthesia Type: General Level of consciousness: awake and alert Pain management: pain level controlled Vital Signs Assessment: post-procedure vital signs reviewed and stable Respiratory status: spontaneous breathing, nonlabored ventilation, respiratory function stable and patient connected to nasal cannula oxygen Cardiovascular status: blood pressure returned to baseline and stable Postop Assessment: no apparent nausea or vomiting Anesthetic complications: no  No notable events documented.   Last Vitals:  Vitals:   08/19/22 0935 08/19/22 0945  BP: (!) 117/55 (!) 114/59  Pulse: 70   Resp: 16 18  Temp:    SpO2: 99%     Last Pain:  Vitals:   08/19/22 0945  TempSrc:   PainSc: 0-No pain                 Ilene Qua

## 2022-08-20 ENCOUNTER — Encounter: Payer: Self-pay | Admitting: Gastroenterology

## 2022-08-20 LAB — SURGICAL PATHOLOGY

## 2022-08-21 ENCOUNTER — Encounter: Payer: Self-pay | Admitting: Gastroenterology

## 2022-09-23 ENCOUNTER — Other Ambulatory Visit (HOSPITAL_COMMUNITY): Payer: Self-pay

## 2022-09-29 ENCOUNTER — Other Ambulatory Visit: Payer: Self-pay

## 2022-10-07 ENCOUNTER — Other Ambulatory Visit (HOSPITAL_COMMUNITY): Payer: Self-pay

## 2022-10-19 ENCOUNTER — Other Ambulatory Visit (HOSPITAL_COMMUNITY): Payer: Self-pay

## 2022-12-07 ENCOUNTER — Other Ambulatory Visit (HOSPITAL_COMMUNITY): Payer: Self-pay

## 2022-12-16 ENCOUNTER — Other Ambulatory Visit: Payer: Self-pay

## 2022-12-16 ENCOUNTER — Other Ambulatory Visit: Payer: Self-pay | Admitting: Primary Care

## 2022-12-16 DIAGNOSIS — R3912 Poor urinary stream: Secondary | ICD-10-CM

## 2022-12-16 DIAGNOSIS — I10 Essential (primary) hypertension: Secondary | ICD-10-CM

## 2022-12-16 MED ORDER — TAMSULOSIN HCL 0.4 MG PO CAPS
0.4000 mg | ORAL_CAPSULE | Freq: Every day | ORAL | 0 refills | Status: DC
  Filled 2022-12-16: qty 30, 30d supply, fill #0

## 2022-12-16 MED ORDER — AMLODIPINE BESYLATE 5 MG PO TABS
5.0000 mg | ORAL_TABLET | Freq: Every day | ORAL | 0 refills | Status: DC
  Filled 2022-12-16: qty 30, 30d supply, fill #0

## 2022-12-16 NOTE — Telephone Encounter (Signed)
Patient is due for CPE/follow up, this will be required prior to any further refills.  Please schedule, thank you!   

## 2022-12-17 ENCOUNTER — Other Ambulatory Visit: Payer: Self-pay

## 2022-12-17 NOTE — Telephone Encounter (Signed)
Patient has been scheduled

## 2022-12-17 NOTE — Telephone Encounter (Signed)
LVM for pt to cb and schedule and sent MyChart message.

## 2022-12-30 ENCOUNTER — Encounter: Payer: Self-pay | Admitting: Primary Care

## 2022-12-30 ENCOUNTER — Other Ambulatory Visit: Payer: Self-pay

## 2022-12-30 ENCOUNTER — Ambulatory Visit: Payer: 59 | Admitting: Primary Care

## 2022-12-30 VITALS — BP 128/82 | HR 70 | Temp 98.2°F | Ht 68.0 in | Wt 235.0 lb

## 2022-12-30 DIAGNOSIS — I10 Essential (primary) hypertension: Secondary | ICD-10-CM | POA: Diagnosis not present

## 2022-12-30 DIAGNOSIS — N1831 Chronic kidney disease, stage 3a: Secondary | ICD-10-CM | POA: Diagnosis not present

## 2022-12-30 DIAGNOSIS — Z125 Encounter for screening for malignant neoplasm of prostate: Secondary | ICD-10-CM | POA: Diagnosis not present

## 2022-12-30 DIAGNOSIS — R3912 Poor urinary stream: Secondary | ICD-10-CM | POA: Diagnosis not present

## 2022-12-30 DIAGNOSIS — Z0001 Encounter for general adult medical examination with abnormal findings: Secondary | ICD-10-CM

## 2022-12-30 DIAGNOSIS — Z23 Encounter for immunization: Secondary | ICD-10-CM | POA: Diagnosis not present

## 2022-12-30 DIAGNOSIS — R0683 Snoring: Secondary | ICD-10-CM

## 2022-12-30 DIAGNOSIS — R7303 Prediabetes: Secondary | ICD-10-CM | POA: Diagnosis not present

## 2022-12-30 DIAGNOSIS — Z86010 Personal history of colonic polyps: Secondary | ICD-10-CM

## 2022-12-30 LAB — LIPID PANEL
Cholesterol: 149 mg/dL (ref 0–200)
HDL: 46.9 mg/dL (ref >39.00)
LDL Cholesterol: 80 mg/dL (ref 0–99)
NonHDL: 101.78
Total CHOL/HDL Ratio: 3
Triglycerides: 111 mg/dL (ref 0.0–149.0)
VLDL: 22.2 mg/dL (ref 0.0–40.0)

## 2022-12-30 LAB — COMPREHENSIVE METABOLIC PANEL
ALT: 26 U/L (ref 0–53)
AST: 32 U/L (ref 0–37)
Albumin: 4.2 g/dL (ref 3.5–5.2)
Alkaline Phosphatase: 92 U/L (ref 39–117)
BUN: 15 mg/dL (ref 6–23)
CO2: 25 mEq/L (ref 19–32)
Calcium: 9.3 mg/dL (ref 8.4–10.5)
Chloride: 105 mEq/L (ref 96–112)
Creatinine, Ser: 1.48 mg/dL (ref 0.40–1.50)
GFR: 53.86 mL/min — ABNORMAL LOW (ref >60.00)
Glucose, Bld: 92 mg/dL (ref 70–99)
Potassium: 4.4 mEq/L (ref 3.5–5.1)
Sodium: 138 mEq/L (ref 135–145)
Total Bilirubin: 0.5 mg/dL (ref 0.2–1.2)
Total Protein: 7.3 g/dL (ref 6.0–8.3)

## 2022-12-30 LAB — HEMOGLOBIN A1C: Hgb A1c MFr Bld: 5.6 % (ref 4.6–6.5)

## 2022-12-30 LAB — PSA: PSA: 1.06 ng/mL (ref 0.10–4.00)

## 2022-12-30 MED ORDER — TAMSULOSIN HCL 0.4 MG PO CAPS
0.8000 mg | ORAL_CAPSULE | Freq: Every day | ORAL | 3 refills | Status: DC
  Filled 2022-12-30 – 2023-01-25 (×2): qty 180, 90d supply, fill #0
  Filled 2023-07-28: qty 180, 90d supply, fill #1

## 2022-12-30 NOTE — Assessment & Plan Note (Signed)
Repeat renal function pending. 

## 2022-12-30 NOTE — Assessment & Plan Note (Signed)
Second Shingrix vaccine due, provided today Colonoscopy UTD, due 2026 PSA due and pending.  Discussed the importance of a healthy diet and regular exercise in order for weight loss, and to reduce the risk of further co-morbidity.  Exam stable. Labs pending.  Follow up in 1 year for repeat physical.

## 2022-12-30 NOTE — Progress Notes (Signed)
Subjective:    Patient ID: Keith Mills, male    DOB: 10-27-1970, 52 y.o.   MRN: [id number]  HPI  Keith Mills is a very pleasant 52 y.o. male who presents today for complete physical and follow up of chronic conditions.  He would also like to discuss snoring. Chronic history of snoring and his wife has witnessed him with periods of apnea and shallower breathing. He feels unrested in the morning, does have daytime drowsiness at times.   Immunizations: -Tetanus: Completed in 2018 -Shingles: Completed Shingrix dose x 1   Diet: Fair diet.  Exercise: No regular exercise.  Eye exam: Completed several years ago Dental exam: Completes semi-annually    Colonoscopy: Completed in 2023, due 2026  PSA: Due   BP Readings from Last 3 Encounters:  12/30/22 128/82  08/19/22 123/85  01/16/22 134/84        Review of Systems  Constitutional:  Negative for unexpected weight change.  HENT:  Negative for rhinorrhea.   Respiratory:  Negative for cough and shortness of breath.   Cardiovascular:  Negative for chest pain.  Gastrointestinal:  Negative for constipation and diarrhea.  Genitourinary:  Positive for difficulty urinating.  Musculoskeletal:  Negative for arthralgias and myalgias.  Skin:  Negative for rash.  Allergic/Immunologic: Negative for environmental allergies.  Neurological:  Negative for dizziness and headaches.  Psychiatric/Behavioral:  The patient is not nervous/anxious.          Past Medical History:  Diagnosis Date   Hypertension    SBO (small bowel obstruction) 08/19/2021    Social History   Socioeconomic History   Marital status: Married    Spouse name: Not on file   Number of children: Not on file   Years of education: Not on file   Highest education level: Not on file  Occupational History   Not on file  Tobacco Use   Smoking status: Never   Smokeless tobacco: Never  Vaping Use   Vaping Use: Never used  Substance and Sexual Activity    Alcohol use: Yes    Alcohol/week: 1.0 - 2.0 standard drink of alcohol    Types: 1 - 2 Cans of beer per week    Comment: moderate   Drug use: Never   Sexual activity: Not on file  Other Topics Concern   Not on file  Social History Narrative   Married.   2 children.   Works as a Psychologist, sport and exercise.   Enjoys playing sports with his children, exercising.    Social Determinants of Health   Financial Resource Strain: Not on file  Food Insecurity: No Food Insecurity (10/30/2021)   Hunger Vital Sign    Worried About Running Out of Food in the Last Year: Never true    Ran Out of Food in the Last Year: Never true  Transportation Needs: No Transportation Needs (10/30/2021)   PRAPARE - Administrator, Civil Service (Medical): No    Lack of Transportation (Non-Medical): No  Physical Activity: Not on file  Stress: Not on file  Social Connections: Not on file  Intimate Partner Violence: Not on file    Past Surgical History:  Procedure Laterality Date   COLON SURGERY     COLONOSCOPY N/A 04/08/2021   Procedure: COLONOSCOPY;  Surgeon: Wyline Mood, MD;  Location: [facility] ENDOSCOPY;  Service: Gastroenterology;  Laterality: N/A;   COLONOSCOPY WITH PROPOFOL N/A 08/19/2022   Procedure: COLONOSCOPY WITH PROPOFOL;  Surgeon: Wyline Mood, MD;  Location: [facility] ENDOSCOPY;  Service: Gastroenterology;  Laterality: N/A;    Family History  Problem Relation Age of Onset   Kidney failure Mother    Hypertension Mother    Breast cancer Mother    Prostate cancer Father 67   Heart disease Father    Hypertension Sister    Colon polyps Sister     No Known Allergies  Current Outpatient Medications on File Prior to Visit  Medication Sig Dispense Refill   amLODipine (NORVASC) 5 MG tablet Take 1 tablet (5 mg total) by mouth daily. For blood pressure. 30 tablet 0   Multiple Vitamin (MULTIVITAMIN ADULT PO) Take by mouth.     ondansetron (ZOFRAN) 4 MG tablet Take 1 tablet (4 mg total) by mouth every 8  (eight) hours as needed for nausea or vomiting. (Patient not taking: Reported on 12/30/2022) 20 tablet 0   No current facility-administered medications on file prior to visit.    BP 128/82   Pulse 70   Temp 98.2 F (36.8 C) (Temporal)   Ht  (1.727 m)   Wt 235 lb (106.6 kg)   SpO2 97%   BMI 35.73 kg/m  Objective:   Physical Exam HENT:     Right Ear: Tympanic membrane and ear canal normal.     Left Ear: Tympanic membrane and ear canal normal.     Nose: Nose normal.     Right Sinus: No maxillary sinus tenderness or frontal sinus tenderness.     Left Sinus: No maxillary sinus tenderness or frontal sinus tenderness.  Eyes:     Conjunctiva/sclera: Conjunctivae normal.  Neck:     Thyroid: No thyromegaly.     Vascular: No carotid bruit.  Cardiovascular:     Rate and Rhythm: Normal rate and regular rhythm.     Heart sounds: Normal heart sounds.  Pulmonary:     Effort: Pulmonary effort is normal.     Breath sounds: Normal breath sounds. No wheezing or rales.  Abdominal:     General: Bowel sounds are normal.     Palpations: Abdomen is soft.     Tenderness: There is no abdominal tenderness.  Musculoskeletal:        General: Normal range of motion.     Cervical back: Neck supple.  Skin:    General: Skin is warm and dry.  Neurological:     Mental Status: He is alert and oriented to person, place, and time.     Cranial Nerves: No cranial nerve deficit.     Deep Tendon Reflexes: Reflexes are normal and symmetric.  Psychiatric:        Mood and Affect: Mood normal.           Assessment & Plan:  Encounter for annual general medical examination with abnormal findings in adult Assessment & Plan: Second Shingrix vaccine due, provided today Colonoscopy UTD, due 2026 PSA due and pending.  Discussed the importance of a healthy diet and regular exercise in order for weight loss, and to reduce the risk of further co-morbidity.  Exam stable. Labs pending.  Follow up in 1  year for repeat physical.    Weak urinary stream Assessment & Plan: Improved but within continued symptoms. Reviewed Urology notes from May 2023.  Increase Tamsulosin to 0.8 mg. He will update.   Orders: -     Tamsulosin HCl; Take 2 capsules (0.8 mg total) by mouth daily. For urine flow  Dispense: 180 capsule; Refill: 3  Personal history of colonic polyps Assessment & Plan: Colonoscopy UTD,  due in 2026   Prediabetes Assessment & Plan: Repeat A1C pending.  Discussed the importance of a healthy diet and regular exercise in order for weight loss, and to reduce the risk of further co-morbidity.   Orders: -     Lipid panel -     Hemoglobin A1c -     Comprehensive metabolic panel  Stage 3a chronic kidney disease Assessment & Plan: Repeat renal function pending.   Snoring Assessment & Plan: Symptoms suspicious for sleep apnea. Referral placed to pulmonology.  Orders: -     Ambulatory referral to Pulmonology  Screening for prostate cancer -     PSA  Essential hypertension Assessment & Plan: Controlled.  Continue amlodipine 5 mg daily.          Doreene Nest, NP

## 2022-12-30 NOTE — Assessment & Plan Note (Signed)
Colonoscopy UTD, due in 2026. 

## 2022-12-30 NOTE — Patient Instructions (Signed)
Stop by the lab prior to leaving today. I will notify you of your results once received.   We increased the dose of your tamsulosin to 2 capsules daily for urine flow.  You will either be contacted via phone regarding your referral to pulmonology for the sleep study, or you may receive a letter on your MyChart portal from our referral team with instructions for scheduling an appointment. Please let us know if you have not been contacted by anyone within two weeks.  It was a pleasure to see you today!

## 2022-12-30 NOTE — Assessment & Plan Note (Signed)
Symptoms suspicious for sleep apnea. Referral placed to pulmonology.

## 2022-12-30 NOTE — Assessment & Plan Note (Signed)
Repeat A1C pending.  Discussed the importance of a healthy diet and regular exercise in order for weight loss, and to reduce the risk of further co-morbidity.  

## 2022-12-30 NOTE — Assessment & Plan Note (Signed)
Improved but within continued symptoms. Reviewed Urology notes from May 2023.  Increase Tamsulosin to 0.8 mg. He will update.

## 2022-12-30 NOTE — Assessment & Plan Note (Signed)
Controlled. Continue amlodipine 5mg daily.

## 2023-01-07 ENCOUNTER — Other Ambulatory Visit (HOSPITAL_COMMUNITY): Payer: Self-pay

## 2023-01-25 ENCOUNTER — Other Ambulatory Visit: Payer: Self-pay

## 2023-01-25 ENCOUNTER — Other Ambulatory Visit: Payer: Self-pay | Admitting: Primary Care

## 2023-01-25 DIAGNOSIS — I10 Essential (primary) hypertension: Secondary | ICD-10-CM

## 2023-01-25 MED ORDER — AMLODIPINE BESYLATE 5 MG PO TABS
5.0000 mg | ORAL_TABLET | Freq: Every day | ORAL | 2 refills | Status: DC
  Filled 2023-01-25: qty 90, 90d supply, fill #0
  Filled 2023-05-11: qty 90, 90d supply, fill #1
  Filled 2023-08-26: qty 90, 90d supply, fill #2

## 2023-01-26 ENCOUNTER — Other Ambulatory Visit: Payer: Self-pay

## 2023-07-01 DIAGNOSIS — M9903 Segmental and somatic dysfunction of lumbar region: Secondary | ICD-10-CM | POA: Diagnosis not present

## 2023-07-01 DIAGNOSIS — M9901 Segmental and somatic dysfunction of cervical region: Secondary | ICD-10-CM | POA: Diagnosis not present

## 2023-07-01 DIAGNOSIS — M9902 Segmental and somatic dysfunction of thoracic region: Secondary | ICD-10-CM | POA: Diagnosis not present

## 2023-07-01 DIAGNOSIS — M25512 Pain in left shoulder: Secondary | ICD-10-CM | POA: Diagnosis not present

## 2023-07-02 DIAGNOSIS — M9902 Segmental and somatic dysfunction of thoracic region: Secondary | ICD-10-CM | POA: Diagnosis not present

## 2023-07-02 DIAGNOSIS — M9901 Segmental and somatic dysfunction of cervical region: Secondary | ICD-10-CM | POA: Diagnosis not present

## 2023-07-02 DIAGNOSIS — M9903 Segmental and somatic dysfunction of lumbar region: Secondary | ICD-10-CM | POA: Diagnosis not present

## 2023-07-02 DIAGNOSIS — M25512 Pain in left shoulder: Secondary | ICD-10-CM | POA: Diagnosis not present

## 2023-07-07 DIAGNOSIS — M9903 Segmental and somatic dysfunction of lumbar region: Secondary | ICD-10-CM | POA: Diagnosis not present

## 2023-07-07 DIAGNOSIS — M25512 Pain in left shoulder: Secondary | ICD-10-CM | POA: Diagnosis not present

## 2023-07-07 DIAGNOSIS — M9902 Segmental and somatic dysfunction of thoracic region: Secondary | ICD-10-CM | POA: Diagnosis not present

## 2023-07-07 DIAGNOSIS — M9901 Segmental and somatic dysfunction of cervical region: Secondary | ICD-10-CM | POA: Diagnosis not present

## 2023-07-12 DIAGNOSIS — M25512 Pain in left shoulder: Secondary | ICD-10-CM | POA: Diagnosis not present

## 2023-07-12 DIAGNOSIS — M9903 Segmental and somatic dysfunction of lumbar region: Secondary | ICD-10-CM | POA: Diagnosis not present

## 2023-07-12 DIAGNOSIS — M9901 Segmental and somatic dysfunction of cervical region: Secondary | ICD-10-CM | POA: Diagnosis not present

## 2023-07-12 DIAGNOSIS — M9902 Segmental and somatic dysfunction of thoracic region: Secondary | ICD-10-CM | POA: Diagnosis not present

## 2023-07-19 DIAGNOSIS — M9903 Segmental and somatic dysfunction of lumbar region: Secondary | ICD-10-CM | POA: Diagnosis not present

## 2023-07-19 DIAGNOSIS — M9902 Segmental and somatic dysfunction of thoracic region: Secondary | ICD-10-CM | POA: Diagnosis not present

## 2023-07-19 DIAGNOSIS — M25512 Pain in left shoulder: Secondary | ICD-10-CM | POA: Diagnosis not present

## 2023-07-19 DIAGNOSIS — M9901 Segmental and somatic dysfunction of cervical region: Secondary | ICD-10-CM | POA: Diagnosis not present

## 2023-07-28 DIAGNOSIS — M9903 Segmental and somatic dysfunction of lumbar region: Secondary | ICD-10-CM | POA: Diagnosis not present

## 2023-07-28 DIAGNOSIS — M9902 Segmental and somatic dysfunction of thoracic region: Secondary | ICD-10-CM | POA: Diagnosis not present

## 2023-07-28 DIAGNOSIS — M9901 Segmental and somatic dysfunction of cervical region: Secondary | ICD-10-CM | POA: Diagnosis not present

## 2023-07-28 DIAGNOSIS — M25512 Pain in left shoulder: Secondary | ICD-10-CM | POA: Diagnosis not present

## 2023-07-29 ENCOUNTER — Other Ambulatory Visit: Payer: Self-pay

## 2023-08-04 DIAGNOSIS — M9903 Segmental and somatic dysfunction of lumbar region: Secondary | ICD-10-CM | POA: Diagnosis not present

## 2023-08-04 DIAGNOSIS — M9901 Segmental and somatic dysfunction of cervical region: Secondary | ICD-10-CM | POA: Diagnosis not present

## 2023-08-04 DIAGNOSIS — M25512 Pain in left shoulder: Secondary | ICD-10-CM | POA: Diagnosis not present

## 2023-08-04 DIAGNOSIS — M9902 Segmental and somatic dysfunction of thoracic region: Secondary | ICD-10-CM | POA: Diagnosis not present

## 2023-08-09 DIAGNOSIS — M9903 Segmental and somatic dysfunction of lumbar region: Secondary | ICD-10-CM | POA: Diagnosis not present

## 2023-08-09 DIAGNOSIS — M9902 Segmental and somatic dysfunction of thoracic region: Secondary | ICD-10-CM | POA: Diagnosis not present

## 2023-08-09 DIAGNOSIS — M9901 Segmental and somatic dysfunction of cervical region: Secondary | ICD-10-CM | POA: Diagnosis not present

## 2023-08-09 DIAGNOSIS — M25512 Pain in left shoulder: Secondary | ICD-10-CM | POA: Diagnosis not present

## 2023-08-16 DIAGNOSIS — M25512 Pain in left shoulder: Secondary | ICD-10-CM | POA: Diagnosis not present

## 2023-08-16 DIAGNOSIS — M9901 Segmental and somatic dysfunction of cervical region: Secondary | ICD-10-CM | POA: Diagnosis not present

## 2023-08-16 DIAGNOSIS — M9903 Segmental and somatic dysfunction of lumbar region: Secondary | ICD-10-CM | POA: Diagnosis not present

## 2023-08-16 DIAGNOSIS — M9902 Segmental and somatic dysfunction of thoracic region: Secondary | ICD-10-CM | POA: Diagnosis not present

## 2023-08-23 DIAGNOSIS — M25512 Pain in left shoulder: Secondary | ICD-10-CM | POA: Diagnosis not present

## 2023-08-23 DIAGNOSIS — M9901 Segmental and somatic dysfunction of cervical region: Secondary | ICD-10-CM | POA: Diagnosis not present

## 2023-08-23 DIAGNOSIS — M9903 Segmental and somatic dysfunction of lumbar region: Secondary | ICD-10-CM | POA: Diagnosis not present

## 2023-08-23 DIAGNOSIS — M9902 Segmental and somatic dysfunction of thoracic region: Secondary | ICD-10-CM | POA: Diagnosis not present

## 2023-08-25 DIAGNOSIS — M9901 Segmental and somatic dysfunction of cervical region: Secondary | ICD-10-CM | POA: Diagnosis not present

## 2023-08-25 DIAGNOSIS — M9903 Segmental and somatic dysfunction of lumbar region: Secondary | ICD-10-CM | POA: Diagnosis not present

## 2023-08-25 DIAGNOSIS — M25512 Pain in left shoulder: Secondary | ICD-10-CM | POA: Diagnosis not present

## 2023-08-25 DIAGNOSIS — M9902 Segmental and somatic dysfunction of thoracic region: Secondary | ICD-10-CM | POA: Diagnosis not present

## 2023-08-26 ENCOUNTER — Other Ambulatory Visit: Payer: Self-pay

## 2023-09-20 ENCOUNTER — Other Ambulatory Visit: Payer: Self-pay

## 2023-09-30 ENCOUNTER — Other Ambulatory Visit (HOSPITAL_COMMUNITY): Payer: Self-pay

## 2023-12-01 ENCOUNTER — Other Ambulatory Visit: Payer: Self-pay

## 2023-12-01 ENCOUNTER — Other Ambulatory Visit: Payer: Self-pay | Admitting: Primary Care

## 2023-12-01 DIAGNOSIS — I10 Essential (primary) hypertension: Secondary | ICD-10-CM

## 2023-12-01 MED ORDER — AMLODIPINE BESYLATE 5 MG PO TABS
5.0000 mg | ORAL_TABLET | Freq: Every day | ORAL | 0 refills | Status: DC
  Filled 2023-12-01: qty 30, 30d supply, fill #0

## 2023-12-01 NOTE — Telephone Encounter (Signed)
 Patient is due for CPE/follow up for late April, this will be required prior to any further refills.  Please schedule, thank you!

## 2023-12-02 NOTE — Telephone Encounter (Signed)
 Spoke to pt, scheduled cpe for 01/05/24

## 2024-01-03 ENCOUNTER — Other Ambulatory Visit (HOSPITAL_COMMUNITY): Payer: Self-pay

## 2024-01-05 ENCOUNTER — Encounter: Payer: Self-pay | Admitting: Primary Care

## 2024-01-05 ENCOUNTER — Other Ambulatory Visit: Payer: Self-pay

## 2024-01-05 ENCOUNTER — Ambulatory Visit (INDEPENDENT_AMBULATORY_CARE_PROVIDER_SITE_OTHER): Admitting: Primary Care

## 2024-01-05 VITALS — BP 138/82 | HR 70 | Temp 98.4°F | Ht 68.0 in | Wt 231.0 lb

## 2024-01-05 DIAGNOSIS — R7303 Prediabetes: Secondary | ICD-10-CM | POA: Diagnosis not present

## 2024-01-05 DIAGNOSIS — Z0000 Encounter for general adult medical examination without abnormal findings: Secondary | ICD-10-CM

## 2024-01-05 DIAGNOSIS — I10 Essential (primary) hypertension: Secondary | ICD-10-CM | POA: Diagnosis not present

## 2024-01-05 DIAGNOSIS — R3912 Poor urinary stream: Secondary | ICD-10-CM

## 2024-01-05 DIAGNOSIS — Z125 Encounter for screening for malignant neoplasm of prostate: Secondary | ICD-10-CM | POA: Diagnosis not present

## 2024-01-05 DIAGNOSIS — N1831 Chronic kidney disease, stage 3a: Secondary | ICD-10-CM

## 2024-01-05 LAB — COMPREHENSIVE METABOLIC PANEL WITH GFR
ALT: 26 U/L (ref 0–53)
AST: 29 U/L (ref 0–37)
Albumin: 4.2 g/dL (ref 3.5–5.2)
Alkaline Phosphatase: 86 U/L (ref 39–117)
BUN: 18 mg/dL (ref 6–23)
CO2: 27 meq/L (ref 19–32)
Calcium: 9.2 mg/dL (ref 8.4–10.5)
Chloride: 104 meq/L (ref 96–112)
Creatinine, Ser: 1.43 mg/dL (ref 0.40–1.50)
GFR: 55.73 mL/min — ABNORMAL LOW (ref >60.00)
Glucose, Bld: 113 mg/dL — ABNORMAL HIGH (ref 70–99)
Potassium: 4.1 meq/L (ref 3.5–5.1)
Sodium: 137 meq/L (ref 135–145)
Total Bilirubin: 0.7 mg/dL (ref 0.2–1.2)
Total Protein: 7 g/dL (ref 6.0–8.3)

## 2024-01-05 LAB — HEMOGLOBIN A1C: Hgb A1c MFr Bld: 5.8 % (ref 4.6–6.5)

## 2024-01-05 LAB — LIPID PANEL
Cholesterol: 167 mg/dL (ref 0–200)
HDL: 46.6 mg/dL
LDL Cholesterol: 101 mg/dL — ABNORMAL HIGH (ref 0–99)
NonHDL: 120.81
Total CHOL/HDL Ratio: 4
Triglycerides: 100 mg/dL (ref 0.0–149.0)
VLDL: 20 mg/dL (ref 0.0–40.0)

## 2024-01-05 LAB — PSA: PSA: 0.98 ng/mL (ref 0.10–4.00)

## 2024-01-05 NOTE — Assessment & Plan Note (Signed)
 Borderline high. Home readings are better.   He is working on resuming regular exercise and a healtiher diet.  Continue amlodipine  5 mg daily. He will continue monitor blood pressure at home.

## 2024-01-05 NOTE — Progress Notes (Signed)
 Subjective:    Patient ID: Keith Mills, male    DOB: 1971/03/25, 53 y.o.   MRN: [id number]  HPI  Keith Mills is a very pleasant 53 y.o. male who presents today for complete physical and follow up of chronic conditions.   Immunizations: -Tetanus: Completed in 2018 -Shingles: Completed Shingrix  series  Diet: Fair diet.  Exercise: No regular exercise.  Eye exam: Completed several years  Dental exam: Completes semi-annually    Colonoscopy: Completed in 2023, due 2026  PSA: Due  BP Readings from Last 3 Encounters:  01/05/24 138/82  12/30/22 128/82  08/19/22 123/85          Review of Systems  Constitutional:  Negative for unexpected weight change.  HENT:  Negative for rhinorrhea.   Respiratory:  Negative for cough and shortness of breath.   Cardiovascular:  Negative for chest pain.  Gastrointestinal:  Negative for constipation and diarrhea.  Genitourinary:  Negative for difficulty urinating.  Musculoskeletal:  Positive for arthralgias and myalgias.  Skin:  Negative for rash.  Allergic/Immunologic: Negative for environmental allergies.  Neurological:  Negative for dizziness and headaches.  Psychiatric/Behavioral:  The patient is not nervous/anxious.          Past Medical History:  Diagnosis Date   Hypertension    SBO (small bowel obstruction) (HCC) 08/19/2021    Social History   Socioeconomic History   Marital status: Married    Spouse name: Not on file   Number of children: Not on file   Years of education: Not on file   Highest education level: Not on file  Occupational History   Not on file  Tobacco Use   Smoking status: Never   Smokeless tobacco: Never  Vaping Use   Vaping status: Never Used  Substance and Sexual Activity   Alcohol use: Yes    Alcohol/week: 1.0 - 2.0 standard drink of alcohol    Types: 1 - 2 Cans of beer per week    Comment: moderate   Drug use: Never   Sexual activity: Not on file  Other Topics Concern   Not on file   Social History Narrative   Married.   2 children.   Works as a Psychologist, sport and exercise.   Enjoys playing sports with his children, exercising.    Social Drivers of Corporate investment banker Strain: Not on file  Food Insecurity: No Food Insecurity (10/30/2021)   Hunger Vital Sign    Worried About Running Out of Food in the Last Year: Never true    Ran Out of Food in the Last Year: Never true  Transportation Needs: No Transportation Needs (10/30/2021)   PRAPARE - Administrator, Civil Service (Medical): No    Lack of Transportation (Non-Medical): No  Physical Activity: Not on file  Stress: Not on file  Social Connections: Not on file  Intimate Partner Violence: Not on file    Past Surgical History:  Procedure Laterality Date   COLON SURGERY     COLONOSCOPY N/A 04/08/2021   Procedure: COLONOSCOPY;  Surgeon: Luke Salaam, MD;  Location: [facility] ENDOSCOPY;  Service: Gastroenterology;  Laterality: N/A;   COLONOSCOPY WITH PROPOFOL  N/A 08/19/2022   Procedure: COLONOSCOPY WITH PROPOFOL ;  Surgeon: Luke Salaam, MD;  Location: [facility] ENDOSCOPY;  Service: Gastroenterology;  Laterality: N/A;    Family History  Problem Relation Age of Onset   Kidney failure Mother    Hypertension Mother    Breast cancer Mother    Prostate cancer Father  63   Heart disease Father    Hypertension Sister    Colon polyps Sister     No Known Allergies  Current Outpatient Medications on File Prior to Visit  Medication Sig Dispense Refill   amLODipine  (NORVASC ) 5 MG tablet Take 1 tablet (5 mg total) by mouth daily. For blood pressure. 30 tablet 0   Multiple Vitamin (MULTIVITAMIN ADULT PO) Take by mouth.     tamsulosin  (FLOMAX ) 0.4 MG CAPS capsule Take 2 capsules (0.8 mg total) by mouth daily. For urine flow 180 capsule 3   ondansetron  (ZOFRAN ) 4 MG tablet Take 1 tablet (4 mg total) by mouth every 8 (eight) hours as needed for nausea or vomiting. (Patient not taking: Reported on 01/05/2024) 20 tablet 0   No  current facility-administered medications on file prior to visit.    BP 138/82   Pulse 70   Temp 98.4 F (36.9 C) (Temporal)   Ht 5\' 8"  (1.727 m)   Wt 231 lb (104.8 kg)   SpO2 98%   BMI 35.12 kg/m  Objective:   Physical Exam HENT:     Right Ear: Tympanic membrane and ear canal normal.     Left Ear: Tympanic membrane and ear canal normal.  Eyes:     Pupils: Pupils are equal, round, and reactive to light.  Cardiovascular:     Rate and Rhythm: Normal rate and regular rhythm.  Pulmonary:     Effort: Pulmonary effort is normal.     Breath sounds: Normal breath sounds.  Abdominal:     General: Bowel sounds are normal.     Palpations: Abdomen is soft.     Tenderness: There is no abdominal tenderness.  Musculoskeletal:        General: Normal range of motion.     Cervical back: Neck supple.  Skin:    General: Skin is warm and dry.  Neurological:     Mental Status: He is alert and oriented to person, place, and time.     Cranial Nerves: No cranial nerve deficit.     Deep Tendon Reflexes:     Reflex Scores:      Patellar reflexes are 2+ on the right side and 2+ on the left side. Psychiatric:        Mood and Affect: Mood normal.           Assessment & Plan:  Preventative health care Assessment & Plan: Immunizations UTD. Colonoscopy UTD, due 2026  PSA due and pending.  Discussed the importance of a healthy diet and regular exercise in order for weight loss, and to reduce the risk of further co-morbidity.  Exam stable. Labs pending.  Follow up in 1 year for repeat physical.    Stage 3a chronic kidney disease (HCC) Assessment & Plan: Repeat renal function pending. Work on BP control.    Prediabetes Assessment & Plan: Repeat A1C pending.  Discussed the importance of a healthy diet and regular exercise in order for weight loss, and to reduce the risk of further co-morbidity.   Orders: -     Comprehensive metabolic panel with GFR -     Lipid panel -      Hemoglobin A1c  Weak urinary stream Assessment & Plan: Overall controlled.  Continue tamsuloin 0.4 mg daily, side effects with 0.8 mg daily. Discussed other options including Cialis 5 mg daily. Reviewed Urology notes from 2023.  He will update if he'd like to pursue.    Screening for prostate cancer -  PSA  Essential hypertension Assessment & Plan: Borderline high. Home readings are better.   He is working on resuming regular exercise and a healtiher diet.  Continue amlodipine  5 mg daily. He will continue monitor blood pressure at home.         Asanti Craigo K Nikko Goldwire, NP

## 2024-01-05 NOTE — Assessment & Plan Note (Signed)
 Immunizations UTD. Colonoscopy UTD, due 2026 PSA due and pending.  Discussed the importance of a healthy diet and regular exercise in order for weight loss, and to reduce the risk of further co-morbidity.  Exam stable. Labs pending.  Follow up in 1 year for repeat physical.

## 2024-01-05 NOTE — Assessment & Plan Note (Signed)
 Overall controlled.  Continue tamsuloin 0.4 mg daily, side effects with 0.8 mg daily. Discussed other options including Cialis 5 mg daily. Reviewed Urology notes from 2023.  He will update if he'd like to pursue.

## 2024-01-05 NOTE — Assessment & Plan Note (Signed)
 Repeat renal function pending. Work on BP control.

## 2024-01-05 NOTE — Assessment & Plan Note (Signed)
Repeat A1C pending.  Discussed the importance of a healthy diet and regular exercise in order for weight loss, and to reduce the risk of further co-morbidity.  

## 2024-01-05 NOTE — Telephone Encounter (Signed)
 Patient has requested refill on Amlodipine  5mg .

## 2024-01-05 NOTE — Patient Instructions (Signed)
 Stop by the lab prior to leaving today. I will notify you of your results once received.   It was a pleasure to see you today!

## 2024-01-06 ENCOUNTER — Other Ambulatory Visit: Payer: Self-pay

## 2024-01-06 MED ORDER — AMLODIPINE BESYLATE 5 MG PO TABS
5.0000 mg | ORAL_TABLET | Freq: Every day | ORAL | 3 refills | Status: AC
  Filled 2024-01-06 – 2024-01-19 (×2): qty 90, 90d supply, fill #0
  Filled 2024-04-24: qty 90, 90d supply, fill #1
  Filled 2024-08-07: qty 90, 90d supply, fill #2

## 2024-01-12 ENCOUNTER — Other Ambulatory Visit (HOSPITAL_COMMUNITY): Payer: Self-pay

## 2024-01-17 ENCOUNTER — Other Ambulatory Visit: Payer: Self-pay

## 2024-01-19 ENCOUNTER — Other Ambulatory Visit: Payer: Self-pay

## 2024-01-26 ENCOUNTER — Other Ambulatory Visit: Payer: Self-pay | Admitting: Primary Care

## 2024-01-26 ENCOUNTER — Other Ambulatory Visit: Payer: Self-pay

## 2024-01-26 DIAGNOSIS — R3912 Poor urinary stream: Secondary | ICD-10-CM

## 2024-01-26 MED ORDER — TAMSULOSIN HCL 0.4 MG PO CAPS
0.8000 mg | ORAL_CAPSULE | Freq: Every day | ORAL | 3 refills | Status: AC
  Filled 2024-01-26: qty 180, 90d supply, fill #0
  Filled 2024-07-25: qty 180, 90d supply, fill #1

## 2025-01-09 ENCOUNTER — Encounter: Admitting: Primary Care
